# Patient Record
Sex: Female | Born: 1995 | Race: White | Hispanic: No | Marital: Single | Smoking: Current every day smoker
Health system: Southern US, Community
[De-identification: ages and names within clinical notes are randomized; demographics above are authoritative.]

## PROBLEM LIST (undated history)

## (undated) DIAGNOSIS — K5900 Constipation, unspecified: Secondary | ICD-10-CM

## (undated) DIAGNOSIS — R569 Unspecified convulsions: Secondary | ICD-10-CM

## (undated) DIAGNOSIS — N289 Disorder of kidney and ureter, unspecified: Secondary | ICD-10-CM

## (undated) DIAGNOSIS — N200 Calculus of kidney: Secondary | ICD-10-CM

## (undated) HISTORY — PX: OTHER SURGICAL HISTORY: SHX169

---
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
## 2015-01-07 ENCOUNTER — Encounter: Payer: Self-pay | Admitting: Emergency Medicine

## 2015-01-07 DIAGNOSIS — R51 Headache: Secondary | ICD-10-CM | POA: Insufficient documentation

## 2015-01-07 DIAGNOSIS — R42 Dizziness and giddiness: Secondary | ICD-10-CM | POA: Insufficient documentation

## 2015-01-07 DIAGNOSIS — R531 Weakness: Secondary | ICD-10-CM | POA: Insufficient documentation

## 2015-01-07 DIAGNOSIS — Z720 Tobacco use: Secondary | ICD-10-CM | POA: Insufficient documentation

## 2015-01-07 MED ORDER — IBUPROFEN 800 MG PO TABS
ORAL_TABLET | ORAL | Status: AC
  Filled 2015-01-07: qty 1

## 2015-01-07 MED ORDER — IBUPROFEN 800 MG PO TABS: 800.0000 mg | ORAL_TABLET | Freq: Once | ORAL | Status: DC

## 2015-01-07 NOTE — ED Notes (Signed)
Pt to ed with c/o headache intermittently x 1 month, progressively getting worse.  Pt also reports dizziness and weakness associated with headache yesterday.

## 2015-01-07 NOTE — ED Notes (Signed)
After I spoke with Dr Derrill Kay and notified family of order for IBU ,  Pt states she is going to leave and go to [facility].  I advised she should wait to see MD,  Pt states "all my doctors are at [facility]"

## 2015-01-08 ENCOUNTER — Emergency Department
Admission: EM | Admit: 2015-01-08 | Discharge: 2015-01-08 | Discharge status: Against Medical Advice | Payer: Medicaid Other | Attending: Emergency Medicine | Admitting: Emergency Medicine

## 2015-04-24 ENCOUNTER — Ambulatory Visit
Admission: EM | Admit: 2015-04-24 | Discharge: 2015-04-24 | Disposition: A | Discharge status: Home / Self Care | Payer: Medicaid Other | Attending: Family Medicine | Admitting: Family Medicine

## 2015-04-24 DIAGNOSIS — H6501 Acute serous otitis media, right ear: Secondary | ICD-10-CM | POA: Diagnosis not present

## 2015-04-24 MED ORDER — AMOXICILLIN 875 MG PO TABS: 875.0000 mg | ORAL_TABLET | Freq: Two times a day (BID) | ORAL | Status: DC

## 2015-04-24 NOTE — ED Provider Notes (Signed)
CSN: [id number]     Arrival date & time 04/24/15  1800 History   None    Chief Complaint  Patient presents with  . Otalgia   (Consider location/radiation/quality/duration/timing/severity/associated sxs/prior Treatment) Patient is a 19 y.o. female presenting with ear pain.  Otalgia Location:  Right Quality:  Aching, pressure and throbbing Severity:  Moderate Onset quality:  Sudden Timing:  Constant Progression:  Unchanged Chronicity:  New Context: not direct blow, not elevation change, not foreign body in ear, not loud noise and no water in ear   Associated symptoms: congestion, cough and rhinorrhea   Associated symptoms: no abdominal pain, no ear discharge and no rash     No past medical history on file. Past Surgical History  Procedure Laterality Date  . Tubes in ears     Family History  Problem Relation Age of Onset  . Depression Mother   . Heart attack Mother   . GER disease Mother   . GER disease Father    Social History  Substance Use Topics  . Smoking status: Current Every Day Smoker  . Smokeless tobacco: Not on file  . Alcohol Use: No   OB History    Gravida Para Term Preterm AB TAB SAB Ectopic Multiple Living   1    1          Review of Systems  HENT: Positive for congestion, ear pain and rhinorrhea. Negative for ear discharge.   Respiratory: Positive for cough.   Gastrointestinal: Negative for abdominal pain.  Skin: Negative for rash.    Allergies  Review of patient's allergies indicates no known allergies.  Home Medications   Prior to Admission medications   Medication Sig Start Date End Date Taking? Authorizing Provider  acetaminophen (TYLENOL) 500 MG tablet Take 500 mg by mouth every 6 (six) hours as needed.   Yes Historical Provider, MD  medroxyPROGESTERone (DEPO-PROVERA) 150 MG/ML injection Inject 150 mg into the muscle every 3 (three) months.   Yes Historical Provider, MD  amoxicillin (AMOXIL) 875 MG tablet Take 1 tablet (875 mg total) by  mouth 2 (two) times daily. 04/24/15   Barbara Mccallumrlando Janice Bodine, MD   Meds Ordered and Administered this Visit  Medications - No data to display  BP 127/83 mmHg  Pulse 68  Temp(Src) 98.4 F (36.9 C) (Oral)  Resp 16  Ht 5\' 8"  (1.727 m)  Wt 180 lb (81.647 kg)  BMI 27.38 kg/m2  SpO2 100%  LMP 04/15/2015 (Within Days) No data found.   Physical Exam  Constitutional: She appears well-developed and well-nourished. No distress.  HENT:  Right Ear: Tympanic membrane is erythematous and bulging. A middle ear effusion is present.  Nose: Nose normal.  Mouth/Throat: Oropharynx is clear and moist.  Neck: Neck supple. No thyromegaly present.  Cardiovascular: Normal rate, regular rhythm and normal heart sounds.   Pulmonary/Chest: Effort normal and breath sounds normal. No respiratory distress. She has no wheezes. She has no rales.  Lymphadenopathy:    She has no cervical adenopathy.  Skin: She is not diaphoretic.  Nursing note and vitals reviewed.   ED Course  Procedures (including critical care time)  Labs Review Labs Reviewed - No data to display  Imaging Review No results found.   Visual Acuity Review  Right Eye Distance:   Left Eye Distance:   Bilateral Distance:    Right Eye Near:   Left Eye Near:    Bilateral Near:         MDM   1. Right  acute serous otitis media, recurrence not specified    Discharge Medication List as of 04/24/2015  7:46 PM    START taking these medications   Details  amoxicillin (AMOXIL) 875 MG tablet Take 1 tablet (875 mg total) by mouth 2 (two) times daily., Starting 04/24/2015, Until Discontinued, Normal       1. diagnosis reviewed with patient  2. rx as per orders above; reviewed possible side effects, interactions, risks and benefits  3. Recommend supportive treatment with otc analgesics  4. Follow-up prn if symptoms worsen or don't improve    Barbara Mccallum, MD 04/24/15 2135

## 2015-04-24 NOTE — ED Notes (Signed)
Right ear since this morning around 0600. Pt reports she was recently sick with URI sx. Pt reports she does not take allergy medication.

## 2015-07-03 ENCOUNTER — Encounter: Payer: Self-pay | Admitting: *Deleted

## 2015-07-03 ENCOUNTER — Emergency Department
Admission: EM | Admit: 2015-07-03 | Discharge: 2015-07-03 | Disposition: A | Discharge status: Home / Self Care | Payer: BLUE CROSS/BLUE SHIELD | Attending: Emergency Medicine | Admitting: Emergency Medicine

## 2015-07-03 DIAGNOSIS — N750 Cyst of Bartholin's gland: Secondary | ICD-10-CM | POA: Diagnosis not present

## 2015-07-03 DIAGNOSIS — N764 Abscess of vulva: Secondary | ICD-10-CM | POA: Insufficient documentation

## 2015-07-03 DIAGNOSIS — Z792 Long term (current) use of antibiotics: Secondary | ICD-10-CM | POA: Diagnosis not present

## 2015-07-03 DIAGNOSIS — Z79899 Other long term (current) drug therapy: Secondary | ICD-10-CM | POA: Diagnosis not present

## 2015-07-03 DIAGNOSIS — F17200 Nicotine dependence, unspecified, uncomplicated: Secondary | ICD-10-CM | POA: Insufficient documentation

## 2015-07-03 DIAGNOSIS — N898 Other specified noninflammatory disorders of vagina: Secondary | ICD-10-CM | POA: Diagnosis present

## 2015-07-03 MED ORDER — SULFAMETHOXAZOLE-TRIMETHOPRIM 800-160 MG PO TABS: 1.0000 | ORAL_TABLET | Freq: Two times a day (BID) | ORAL | Status: DC

## 2015-07-03 MED ORDER — HYDROCODONE-ACETAMINOPHEN 5-325 MG PO TABS
ORAL_TABLET | ORAL | Status: AC
  Filled 2015-07-03: qty 1

## 2015-07-03 MED ORDER — OXYCODONE-ACETAMINOPHEN 5-325 MG PO TABS
1.0000 | ORAL_TABLET | Freq: Once | ORAL | Status: AC
  Administered 2015-07-03: 1 via ORAL
  Filled 2015-07-03: qty 1

## 2015-07-03 MED ORDER — OXYCODONE-ACETAMINOPHEN 5-325 MG PO TABS: 1.0000 | ORAL_TABLET | ORAL | Status: DC | PRN

## 2015-07-03 MED ORDER — LIDOCAINE-EPINEPHRINE (PF) 1 %-1:200000 IJ SOLN
10.0000 mL | Freq: Once | INTRAMUSCULAR | Status: DC
  Filled 2015-07-03: qty 30

## 2015-07-03 NOTE — ED Provider Notes (Signed)
[facility] Emergency Department Provider Note  ____________________________________________  Time seen: Approximately 2:23 PM  I have reviewed the triage vital signs and the nursing notes.   HISTORY  Chief Complaint Abscess    [name] is a 20 y.o. female is here with complaint of right sided vaginal discomfort for approximately 1 month. Patient states the last 2-3 days and this is gotten much worse. She denies any fever or chills. She denies any previous symptoms such as this and has not seen any physician since her pediatrician. Patient has not taken any over-the-counter medication for pain. Currently she rates her pain as 5 out of 10.   History reviewed. No pertinent past medical history.  There are no active problems to display for this patient.   Past Surgical History  Procedure Laterality Date  . Tubes in ears      Current Outpatient Rx  Name  Route  Sig  Dispense  Refill  . acetaminophen (TYLENOL) 500 MG tablet   Oral   Take 500 mg by mouth every 6 (six) hours as needed.         Marland Kitchen amoxicillin (AMOXIL) 875 MG tablet   Oral   Take 1 tablet (875 mg total) by mouth 2 (two) times daily.   20 tablet   0   . medroxyPROGESTERone (DEPO-PROVERA) 150 MG/ML injection   Intramuscular   Inject 150 mg into the muscle every 3 (three) months.         Marland Kitchen oxyCODONE-acetaminophen (PERCOCET) 5-325 MG tablet   Oral   Take 1 tablet by mouth every 4 (four) hours as needed for severe pain.   20 tablet   0   . sulfamethoxazole-trimethoprim (BACTRIM DS,SEPTRA DS) 800-160 MG tablet   Oral   Take 1 tablet by mouth 2 (two) times daily.   20 tablet   0     Allergies Review of patient's allergies indicates no known allergies.  Family History  Problem Relation Age of Onset  . Depression Mother   . Heart attack Mother   . GER disease Mother   . GER disease Father     Social History Social History  Substance Use Topics  . Smoking  status: Current Every Day Smoker  . Smokeless tobacco: None  . Alcohol Use: No    Review of Systems Constitutional: No fever/chills Eyes: No visual changes. ENT: No sore throat. Cardiovascular: Denies chest pain. Respiratory: Denies shortness of breath. Gastrointestinal: No abdominal pain.  No nausea, no vomiting.  No diarrhea.   Genitourinary: Negative for dysuria. Musculoskeletal: Negative for back pain. Skin: Negative for rash. Questionable abscess present. Neurological: Negative for headaches, focal weakness or numbness.  10-point ROS otherwise negative.  ____________________________________________   PHYSICAL EXAM:  VITAL SIGNS: ED Triage Vitals  Enc Vitals Group     BP 07/03/15 1411 126/74 mmHg     Pulse Rate 07/03/15 1411 94     Resp 07/03/15 1411 18     Temp 07/03/15 1411 97.8 F (36.6 C)     Temp Source 07/03/15 1411 Oral     SpO2 07/03/15 1411 97 %     Weight 07/03/15 1411 185 lb (83.915 kg)     Height 07/03/15 1411  (1.727 m)     Head Cir --      Peak Flow --      Pain Score 07/03/15 1416 5     Pain Loc --      Pain Edu? --  Excl. in GC? --     Constitutional: Alert and oriented. Well appearing and in no acute distress. Eyes: Conjunctivae are normal. PERRL. EOMI. Head: Atraumatic. Nose: No congestion/rhinnorhea. Neck: No stridor.   Cardiovascular: Normal rate, regular rhythm. Grossly normal heart sounds.  Good peripheral circulation. Respiratory: Normal respiratory effort.  No retractions. Lungs CTAB. Gastrointestinal: Soft and nontender. No distention.  Musculoskeletal: There is upper and lower extremities without any difficulty. Neurologic:  Normal speech and language. No gross focal neurologic deficits are appreciated. No gait instability. Skin:  Skin is warm, dry and intact. No rash noted. Positive right labial edema with tenderness. Psychiatric: Mood and affect are normal. Speech and behavior are  normal.  ____________________________________________   LABS (all labs ordered are listed, but only abnormal results are displayed)  Labs Reviewed - No data to display  PROCEDURES  Procedure(s) performed: INCISION AND DRAINAGE Performed by: Tommi Rumps Consent: Verbal consent obtained. Risks and benefits: risks, benefits and alternatives were discussed Type: abscess  Body area: Right labia  Anesthesia: local infiltration  Incision was made with a scalpel.  Local anesthetic: lidocaine 1 % with epinephrine  Anesthetic total: 2 ml  Complexity: complex Blunt dissection to break up loculations  Drainage: purulent  Drainage amount: Moderate   Packing material: 1/4 in iodoform gauze  Patient tolerance: Patient tolerated the procedure well with no immediate complications.    Critical Care performed: No  ____________________________________________   INITIAL IMPRESSION / ASSESSMENT AND PLAN / ED COURSE  Pertinent labs & imaging results that were available during my care of the patient were reviewed by me and considered in my medical decision making (see chart for details).  Patient was instructed to return to the emergency room in 2 days for packing removal. She started on Bactrim DS twice a day for 10 days along with Percocet as needed for severe pain. ____________________________________________   FINAL CLINICAL IMPRESSION(S) / ED DIAGNOSES  Final diagnoses:  Labial abscess  Infected cyst of Bartholin's gland duct      Tommi Rumps, PA-C 07/03/15 1608  Governor Rooks, MD 07/03/15 2220

## 2015-07-03 NOTE — ED Notes (Signed)
Pt c/o abscess to R inner lip of vagina.  Pt sts that this has been an ongoing problem for last month, but pain has incr in last couple of days.  This RN palpated small, red swollen area on pts R inner lip.

## 2015-07-03 NOTE — ED Notes (Signed)
States hard know on right side of vagina

## 2015-07-03 NOTE — Discharge Instructions (Signed)
Incision and Drainage Incision and drainage is a procedure in which a sac-like structure (cystic structure) is opened and drained. The area to be drained usually contains material such as pus, fluid, or blood.  LET YOUR CAREGIVER KNOW ABOUT:   Allergies to medicine.  Medicines taken, including vitamins, herbs, eyedrops, over-the-counter medicines, and creams.  Use of steroids (by mouth or creams).  Previous problems with anesthetics or numbing medicines.  History of bleeding problems or blood clots.  Previous surgery.  Other health problems, including diabetes and kidney problems.  Possibility of pregnancy, if this applies. RISKS AND COMPLICATIONS  Pain.  Bleeding.  Scarring.  Infection. BEFORE THE PROCEDURE  You may need to have an ultrasound or other imaging tests to see how large or deep your cystic structure is. Blood tests may also be used to determine if you have an infection or how severe the infection is. You may need to have a tetanus shot. PROCEDURE  The affected area is cleaned with a cleaning fluid. The cyst area will then be numbed with a medicine (local anesthetic). A small incision will be made in the cystic structure. A syringe or catheter may be used to drain the contents of the cystic structure, or the contents may be squeezed out. The area will then be flushed with a cleansing solution. After cleansing the area, it is often gently packed with a gauze or another wound dressing. Once it is packed, it will be covered with gauze and tape or some other type of wound dressing. AFTER THE PROCEDURE   Often, you will be allowed to go home right after the procedure.  You may be given antibiotic medicine to prevent or heal an infection.  If the area was packed with gauze or some other wound dressing, you will likely need to come back in 1 to 2 days to get it removed.  The area should heal in about 14 days.   This information is not intended to replace advice given  to you by your health care provider. Make sure you discuss any questions you have with your health care provider.   Document Released: 11/25/2000 Document Revised: 12/01/2011 Document Reviewed: 07/27/2011 Elsevier Interactive Patient Education [employer]   Return to the emergency room in 2 days for packing removal Take Percocet as needed for pain only as directed. Bactrim DS twice a day for 10 days.

## 2015-07-03 NOTE — ED Notes (Signed)
abscess drained .Marland Kitchen Tolerated well. Marland Kitchen

## 2015-10-18 ENCOUNTER — Encounter: Payer: Self-pay | Admitting: *Deleted

## 2015-10-18 ENCOUNTER — Ambulatory Visit
Admission: EM | Admit: 2015-10-18 | Discharge: 2015-10-18 | Disposition: A | Discharge status: Home / Self Care | Payer: BLUE CROSS/BLUE SHIELD | Attending: Family Medicine | Admitting: Family Medicine

## 2015-10-18 DIAGNOSIS — J020 Streptococcal pharyngitis: Secondary | ICD-10-CM

## 2015-10-18 HISTORY — DX: Disorder of kidney and ureter, unspecified: N28.9

## 2015-10-18 HISTORY — DX: Unspecified convulsions: R56.9

## 2015-10-18 LAB — RAPID STREP SCREEN (MED CTR MEBANE ONLY): Streptococcus, Group A Screen (Direct): POSITIVE — AB

## 2015-10-18 LAB — RAPID INFLUENZA A&B ANTIGENS
Influenza A (ARMC): NEGATIVE
Influenza B (ARMC): NEGATIVE

## 2015-10-18 MED ORDER — PENICILLIN G BENZATHINE 1200000 UNIT/2ML IM SUSP
1.2000 10*6.[IU] | Freq: Once | INTRAMUSCULAR | Status: AC
  Administered 2015-10-18: 1.2 10*6.[IU] via INTRAMUSCULAR

## 2015-10-18 NOTE — ED Provider Notes (Signed)
CSN: [id number]     Arrival date & time 10/18/15  1934 History   First MD Initiated Contact with Patient 10/18/15 2016     Chief Complaint  Patient presents with  . Cough  . Sore Throat  . Nasal Congestion  . Pleurisy   (Consider location/radiation/quality/duration/timing/severity/associated sxs/prior Treatment) Patient is a 20 y.o. female presenting with cough, pharyngitis, and URI. The history is provided by the patient.  Cough Associated symptoms: rhinorrhea and sore throat   Sore Throat  URI Presenting symptoms: congestion, cough, rhinorrhea and sore throat   Severity:  Moderate Onset quality:  Sudden Duration:  1 day Timing:  Constant Progression:  Worsening Chronicity:  New Relieved by:  Nothing Associated symptoms: swollen glands   Risk factors: sick contacts   Risk factors: not elderly, no chronic cardiac disease, no chronic kidney disease, no chronic respiratory disease, no diabetes mellitus, no immunosuppression and no recent travel     Past Medical History  Diagnosis Date  . Seizures (HCC)   . Renal disorder    Past Surgical History  Procedure Laterality Date  . Tubes in ears     Family History  Problem Relation Age of Onset  . Depression Mother   . Heart attack Mother   . GER disease Mother   . GER disease Father    Social History  Substance Use Topics  . Smoking status: Current Every Day Smoker  . Smokeless tobacco: None  . Alcohol Use: No   OB History    Gravida Para Term Preterm AB TAB SAB Ectopic Multiple Living   1    1          Review of Systems  HENT: Positive for congestion, rhinorrhea and sore throat.   Respiratory: Positive for cough.     Allergies  Review of patient's allergies indicates no known allergies.  Home Medications   Prior to Admission medications   Medication Sig Start Date End Date Taking? Authorizing Provider  acetaminophen (TYLENOL) 500 MG tablet Take 500 mg by mouth every 6 (six) hours as needed.    Historical  Provider, MD  amoxicillin (AMOXIL) 875 MG tablet Take 1 tablet (875 mg total) by mouth 2 (two) times daily. 04/24/15   Barbara Mccallumrlando Flynn Lininger, MD  medroxyPROGESTERone (DEPO-PROVERA) 150 MG/ML injection Inject 150 mg into the muscle every 3 (three) months.    Historical Provider, MD  oxyCODONE-acetaminophen (PERCOCET) 5-325 MG tablet Take 1 tablet by mouth every 4 (four) hours as needed for severe pain. 07/03/15   Barbara Rumpshonda L Summers, PA-C  sulfamethoxazole-trimethoprim (BACTRIM DS,SEPTRA DS) 800-160 MG tablet Take 1 tablet by mouth 2 (two) times daily. 07/03/15   Barbara Rumpshonda L Summers, PA-C   Meds Ordered and Administered this Visit   Medications  penicillin g benzathine (BICILLIN LA) 1200000 UNIT/2ML injection 1.2 Million Units (not administered)    BP 124/77 mmHg  Pulse 102  Temp(Src) 97.9 F (36.6 C)  Resp 20  Ht 5\' 8"  (1.727 m)  Wt 165 lb (74.844 kg)  BMI 25.09 kg/m2  SpO2 99% No data found.   Physical Exam  Constitutional: She appears well-developed and well-nourished. No distress.  HENT:  Head: Normocephalic and atraumatic.  Right Ear: Tympanic membrane, external ear and ear canal normal.  Left Ear: Tympanic membrane, external ear and ear canal normal.  Nose: Mucosal edema and rhinorrhea present. No nose lacerations, sinus tenderness, nasal deformity, septal deviation or nasal septal hematoma. No epistaxis.  No foreign bodies.  Mouth/Throat: Uvula is midline and mucous membranes  are normal. Posterior oropharyngeal erythema present. No oropharyngeal exudate.  Eyes: Conjunctivae and EOM are normal. Pupils are equal, round, and reactive to light. Right eye exhibits no discharge. Left eye exhibits no discharge. No scleral icterus.  Neck: Normal range of motion. Neck supple. No thyromegaly present.  Cardiovascular: Normal rate, regular rhythm and normal heart sounds.   Pulmonary/Chest: Effort normal and breath sounds normal. No respiratory distress. She has no wheezes. She has no rales.   Lymphadenopathy:    She has no cervical adenopathy.  Skin: She is not diaphoretic.  Nursing note and vitals reviewed.   ED Course  Procedures (including critical care time)  Labs Review Labs Reviewed  RAPID STREP SCREEN (NOT AT [facility]) - Abnormal; Notable for the following:    Streptococcus, Group A Screen (Direct) POSITIVE (*)    All other components within normal limits  RAPID INFLUENZA A&B ANTIGENS ([facility] ONLY)    Imaging Review No results found.   Visual Acuity Review  Right Eye Distance:   Left Eye Distance:   Bilateral Distance:    Right Eye Near:   Left Eye Near:    Bilateral Near:         MDM   1. Strep pharyngitis    1. Lab results and diagnosis reviewed with patient and parent 2. Bicillin 1.2 mU IM x 1 given in clinic 3. Recommend supportive treatment with salt water gargles and otc analgesics 4. Follow-up prn if symptoms worsen or don't improve    Barbara Mccallum, MD 10/18/15 2111

## 2015-10-18 NOTE — ED Notes (Signed)
Productive cough- yellow, sore throat, runny nose, and chest soreness with coughing and movement. Onset yesterday.

## 2016-02-14 ENCOUNTER — Encounter: Payer: Self-pay | Admitting: Emergency Medicine

## 2016-02-14 ENCOUNTER — Emergency Department
Admission: EM | Admit: 2016-02-14 | Discharge: 2016-02-14 | Disposition: A | Discharge status: Home / Self Care | Payer: BLUE CROSS/BLUE SHIELD | Attending: Emergency Medicine | Admitting: Emergency Medicine

## 2016-02-14 DIAGNOSIS — J309 Allergic rhinitis, unspecified: Secondary | ICD-10-CM | POA: Insufficient documentation

## 2016-02-14 DIAGNOSIS — Z79899 Other long term (current) drug therapy: Secondary | ICD-10-CM | POA: Insufficient documentation

## 2016-02-14 DIAGNOSIS — J029 Acute pharyngitis, unspecified: Secondary | ICD-10-CM | POA: Diagnosis present

## 2016-02-14 DIAGNOSIS — F17200 Nicotine dependence, unspecified, uncomplicated: Secondary | ICD-10-CM | POA: Insufficient documentation

## 2016-02-14 DIAGNOSIS — J069 Acute upper respiratory infection, unspecified: Secondary | ICD-10-CM | POA: Diagnosis not present

## 2016-02-14 HISTORY — DX: Unspecified convulsions: R56.9

## 2016-02-14 HISTORY — DX: Calculus of kidney: N20.0

## 2016-02-14 MED ORDER — BENZONATATE 100 MG PO CAPS: 200.0000 mg | ORAL_CAPSULE | Freq: Three times a day (TID) | ORAL | 0 refills | Status: AC | PRN

## 2016-02-14 MED ORDER — LORATADINE 10 MG PO TABS: 10.0000 mg | ORAL_TABLET | Freq: Every day | ORAL | 0 refills | Status: DC

## 2016-02-14 MED ORDER — FLUTICASONE PROPIONATE 50 MCG/ACT NA SUSP: 2.0000 | Freq: Every day | NASAL | 0 refills | Status: DC

## 2016-02-14 NOTE — Discharge Instructions (Signed)
[facility] any [facility]. Begin using Tessalon Perles as needed for cough. Claritin once a day and this medication is also over-the-counter so you may continue taking this at any time. Also began using Flonase nasal spray 2 sprays in each side of her nose once a day for control of her allergies.

## 2016-02-14 NOTE — ED Provider Notes (Signed)
[facility] Emergency Department Provider Note  ____________________________________________   First MD Initiated Contact with Patient 02/14/16 1636     (approximate)  I have reviewed the triage vital signs and the nursing notes.   HISTORY  Chief Complaint Sore Throat; Cough; and Nasal Congestion   [name] is a 20 y.o. female is here complaining of sore throat, cough, nasal congestion and sneezing. Patient states that this began approximately 2 days ago and she is not taking any over-the-counter medication for this. She denies any nausea or vomiting. There is been no fever or chills. Patient has a history of allergies and for 4 years took medication for this. She is not taking any allergy medication since that time. Patient states she was at work when she felt "lightheaded" while coughing and came straight to the emergency room. Currently she rates her pain as a 2 out of 10.   Past Medical History:  Diagnosis Date  . Kidney stone   . Renal disorder   . Seizure (HCC)   . Seizures (HCC)     There are no active problems to display for this patient.   Past Surgical History:  Procedure Laterality Date  . tubes in ears      Prior to Admission medications   Medication Sig Start Date End Date Taking? Authorizing Provider  acetaminophen (TYLENOL) 500 MG tablet Take 500 mg by mouth every 6 (six) hours as needed.    Historical Provider, MD  amoxicillin (AMOXIL) 875 MG tablet Take 1 tablet (875 mg total) by mouth 2 (two) times daily. 04/24/15   Payton Mccallumrlando Conty, MD  benzonatate (TESSALON PERLES) 100 MG capsule Take 2 capsules (200 mg total) by mouth 3 (three) times daily as needed for cough. 02/14/16 02/13/17  Tommi Rumpshonda L Summers, PA-C  fluticasone (FLONASE) 50 MCG/ACT nasal spray Place 2 sprays into both nostrils daily. 02/14/16 02/13/17  Tommi Rumpshonda L Summers, PA-C  loratadine (CLARITIN) 10 MG tablet Take 1 tablet (10 mg total) by mouth daily. 02/14/16 02/13/17   Tommi Rumpshonda L Summers, PA-C  medroxyPROGESTERone (DEPO-PROVERA) 150 MG/ML injection Inject 150 mg into the muscle every 3 (three) months.    Historical Provider, MD  oxyCODONE-acetaminophen (PERCOCET) 5-325 MG tablet Take 1 tablet by mouth every 4 (four) hours as needed for severe pain. 07/03/15   Tommi Rumpshonda L Summers, PA-C  sulfamethoxazole-trimethoprim (BACTRIM DS,SEPTRA DS) 800-160 MG tablet Take 1 tablet by mouth 2 (two) times daily. 07/03/15   Tommi Rumpshonda L Summers, PA-C    Allergies Review of patient's allergies indicates no known allergies.  Family History  Problem Relation Age of Onset  . Depression Mother   . Heart attack Mother   . GER disease Mother   . GER disease Father     Social History Social History  Substance Use Topics  . Smoking status: Current Every Day Smoker  . Smokeless tobacco: Never Used  . Alcohol use No    Review of Systems Constitutional: No fever/chills Eyes: No visual changes. ENT: Positive sore throat. Positive rhinitis Cardiovascular: Denies chest pain. Respiratory: Denies shortness of breath. Positive for cough. Gastrointestinal:   No nausea, no vomiting.   Skin: Negative for rash. Neurological: Negative for headaches, focal weakness or numbness.  10-point ROS otherwise negative.  ____________________________________________   PHYSICAL EXAM:  VITAL SIGNS: ED Triage Vitals  Enc Vitals Group     BP 02/14/16 1521 114/73     Pulse Rate 02/14/16 1521 98     Resp 02/14/16 1521 18  Temp 02/14/16 1521 98.2 F (36.8 C)     Temp Source 02/14/16 1521 Oral     SpO2 02/14/16 1521 99 %     Weight 02/14/16 1522 165 lb (74.8 kg)     Height 02/14/16 1522 5\' 8"  (1.727 m)     Head Circumference --      Peak Flow --      Pain Score 02/14/16 1522 2     Pain Loc --      Pain Edu? --      Excl. in GC? --     Constitutional: Alert and oriented. Well appearing and in no acute distress. Eyes: Conjunctivae are normal. PERRL. EOMI. Head: Atraumatic. Nose:  Moderate congestion/rhinnorhea.  EACs are clear bilaterally. TMs are dull bilaterally without any injection or erythema.  Mouth/Throat: Mucous membranes are moist.  Oropharynx non-erythematous. Posterior drainage is noted. Neck: No stridor.   Hematological/Lymphatic/Immunilogical: No cervical lymphadenopathy. Cardiovascular: Normal rate, regular rhythm. Grossly normal heart sounds.  Good peripheral circulation. Respiratory: Normal respiratory effort.  No retractions. Lungs CTAB. Musculoskeletal: Moves upper and lower extremities without any difficulty. Normal gait was noted. Neurologic:  Normal speech and language. No gross focal neurologic deficits are appreciated. No gait instability. Skin:  Skin is warm, dry and intact. No rash noted. Psychiatric: Mood and affect are normal. Speech and behavior are normal.  ____________________________________________   LABS (all labs ordered are listed, but only abnormal results are displayed)  Labs Reviewed - No data to display   PROCEDURES  Procedure(s) performed: None  Procedures  Critical Care performed: No  ____________________________________________   INITIAL IMPRESSION / ASSESSMENT AND PLAN / ED COURSE  Pertinent labs & imaging results that were available during my care of the patient were reviewed by me and considered in my medical decision making (see chart for details).    Clinical Course   This is given a prescription for Claritin 10 mg one daily, Flonase nasal spray, and Tessalon Perles as needed for cough. Patient is to follow-up with [facility] if any continued problems. She is also instructed to drink more fluids and if needed Tylenol or ibuprofen.  ____________________________________________   FINAL CLINICAL IMPRESSION(S) / ED DIAGNOSES  Final diagnoses:  Acute upper respiratory infection  Allergic rhinitis, unspecified allergic rhinitis type      NEW MEDICATIONS STARTED DURING THIS VISIT:  Discharge  Medication List as of 02/14/2016  4:49 PM    START taking these medications   Details  benzonatate (TESSALON PERLES) 100 MG capsule Take 2 capsules (200 mg total) by mouth 3 (three) times daily as needed for cough., Starting Fri 02/14/2016, Until Sat 02/13/2017, Print    fluticasone (FLONASE) 50 MCG/ACT nasal spray Place 2 sprays into both nostrils daily., Starting Fri 02/14/2016, Until Sat 02/13/2017, Print    loratadine (CLARITIN) 10 MG tablet Take 1 tablet (10 mg total) by mouth daily., Starting Fri 02/14/2016, Until Sat 02/13/2017, Print         Note:  This document was prepared using Dragon voice recognition software and may include unintentional dictation errors.    Tommi Rumps, PA-C 02/14/16 1657    Emily Filbert, MD 02/15/16 [phone]

## 2016-02-14 NOTE — ED Triage Notes (Signed)
Reports cough, runny nose and sore throat for several days.  Today at work felt lightheaded and her work sent her for eval. NAD, skin w/d.

## 2016-04-17 ENCOUNTER — Encounter: Payer: Self-pay | Admitting: Emergency Medicine

## 2016-04-17 ENCOUNTER — Emergency Department
Admission: EM | Admit: 2016-04-17 | Discharge: 2016-04-17 | Disposition: A | Discharge status: Home / Self Care | Payer: BLUE CROSS/BLUE SHIELD | Attending: Emergency Medicine | Admitting: Emergency Medicine

## 2016-04-17 DIAGNOSIS — Z79899 Other long term (current) drug therapy: Secondary | ICD-10-CM | POA: Insufficient documentation

## 2016-04-17 DIAGNOSIS — Y999 Unspecified external cause status: Secondary | ICD-10-CM | POA: Diagnosis not present

## 2016-04-17 DIAGNOSIS — M7918 Myalgia, other site: Secondary | ICD-10-CM

## 2016-04-17 DIAGNOSIS — S39012A Strain of muscle, fascia and tendon of lower back, initial encounter: Secondary | ICD-10-CM | POA: Insufficient documentation

## 2016-04-17 DIAGNOSIS — Y9389 Activity, other specified: Secondary | ICD-10-CM | POA: Diagnosis not present

## 2016-04-17 DIAGNOSIS — S161XXA Strain of muscle, fascia and tendon at neck level, initial encounter: Secondary | ICD-10-CM | POA: Diagnosis not present

## 2016-04-17 DIAGNOSIS — Y92410 Unspecified street and highway as the place of occurrence of the external cause: Secondary | ICD-10-CM | POA: Insufficient documentation

## 2016-04-17 DIAGNOSIS — F17200 Nicotine dependence, unspecified, uncomplicated: Secondary | ICD-10-CM | POA: Insufficient documentation

## 2016-04-17 DIAGNOSIS — S199XXA Unspecified injury of neck, initial encounter: Secondary | ICD-10-CM | POA: Diagnosis present

## 2016-04-17 MED ORDER — KETOROLAC TROMETHAMINE 60 MG/2ML IM SOLN
30.0000 mg | Freq: Once | INTRAMUSCULAR | Status: AC
  Administered 2016-04-17: 30 mg via INTRAMUSCULAR
  Filled 2016-04-17: qty 2

## 2016-04-17 MED ORDER — OXYCODONE-ACETAMINOPHEN 7.5-325 MG PO TABS: 1.0000 | ORAL_TABLET | Freq: Four times a day (QID) | ORAL | 0 refills | Status: DC | PRN

## 2016-04-17 MED ORDER — IBUPROFEN 600 MG PO TABS: 600.0000 mg | ORAL_TABLET | Freq: Three times a day (TID) | ORAL | 0 refills | Status: DC | PRN

## 2016-04-17 MED ORDER — HYDROMORPHONE HCL 1 MG/ML IJ SOLN
1.0000 mg | Freq: Once | INTRAMUSCULAR | Status: AC
  Administered 2016-04-17: 1 mg via INTRAMUSCULAR
  Filled 2016-04-17: qty 1

## 2016-04-17 MED ORDER — CYCLOBENZAPRINE HCL 10 MG PO TABS: 10.0000 mg | ORAL_TABLET | Freq: Three times a day (TID) | ORAL | 0 refills | Status: DC | PRN

## 2016-04-17 MED ORDER — ORPHENADRINE CITRATE 30 MG/ML IJ SOLN
60.0000 mg | Freq: Two times a day (BID) | INTRAMUSCULAR | Status: DC
  Administered 2016-04-17: 60 mg via INTRAMUSCULAR
  Filled 2016-04-17: qty 2
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
## 2016-04-17 NOTE — ED Notes (Signed)
Patient stated that her feet were cold and asked if she could have a pair of socks. She was given a pair of the brown grip bottom socks.

## 2016-10-05 ENCOUNTER — Ambulatory Visit
Admission: EM | Admit: 2016-10-05 | Discharge: 2016-10-05 | Disposition: A | Discharge status: Home / Self Care | Payer: BLUE CROSS/BLUE SHIELD | Attending: Family Medicine | Admitting: Family Medicine

## 2016-10-05 DIAGNOSIS — M545 Low back pain: Secondary | ICD-10-CM

## 2016-10-05 DIAGNOSIS — S39012A Strain of muscle, fascia and tendon of lower back, initial encounter: Secondary | ICD-10-CM

## 2016-10-05 MED ORDER — NAPROXEN 500 MG PO TABS: 500.0000 mg | ORAL_TABLET | Freq: Two times a day (BID) | ORAL | 0 refills | Status: DC

## 2016-10-05 MED ORDER — TIZANIDINE HCL 4 MG PO CAPS: 4.0000 mg | ORAL_CAPSULE | Freq: Three times a day (TID) | ORAL | 0 refills | Status: DC
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
## 2017-09-03 ENCOUNTER — Ambulatory Visit
Admission: EM | Admit: 2017-09-03 | Discharge: 2017-09-03 | Discharge status: Absent without leave | Payer: BLUE CROSS/BLUE SHIELD

## 2017-12-15 ENCOUNTER — Other Ambulatory Visit: Payer: Self-pay

## 2017-12-15 ENCOUNTER — Encounter: Payer: Self-pay | Admitting: Emergency Medicine

## 2017-12-15 ENCOUNTER — Emergency Department
Admission: EM | Admit: 2017-12-15 | Discharge: 2017-12-15 | Disposition: A | Discharge status: Home / Self Care | Payer: BLUE CROSS/BLUE SHIELD | Attending: Student in an Organized Health Care Education/Training Program | Admitting: Student in an Organized Health Care Education/Training Program

## 2017-12-15 DIAGNOSIS — O039 Complete or unspecified spontaneous abortion without complication: Secondary | ICD-10-CM

## 2017-12-15 DIAGNOSIS — F17200 Nicotine dependence, unspecified, uncomplicated: Secondary | ICD-10-CM | POA: Diagnosis not present

## 2017-12-15 DIAGNOSIS — O209 Hemorrhage in early pregnancy, unspecified: Secondary | ICD-10-CM | POA: Diagnosis present

## 2017-12-15 DIAGNOSIS — Z79899 Other long term (current) drug therapy: Secondary | ICD-10-CM | POA: Insufficient documentation

## 2017-12-15 DIAGNOSIS — Z3A01 Less than 8 weeks gestation of pregnancy: Secondary | ICD-10-CM | POA: Insufficient documentation

## 2017-12-15 DIAGNOSIS — N939 Abnormal uterine and vaginal bleeding, unspecified: Secondary | ICD-10-CM

## 2017-12-15 LAB — URINALYSIS, COMPLETE (UACMP) WITH MICROSCOPIC
Bacteria, UA: NONE SEEN
Bilirubin Urine: NEGATIVE
Glucose, UA: NEGATIVE mg/dL
Ketones, ur: 5 mg/dL — AB
Nitrite: NEGATIVE
PROTEIN: 30 mg/dL — AB
RBC / HPF: >50 RBC/hpf — ABNORMAL HIGH (ref 0–5)
SPECIFIC GRAVITY, URINE: 1.027 (ref 1.005–1.030)
pH: 5 (ref 5.0–8.0)

## 2017-12-15 LAB — CBC WITH DIFFERENTIAL/PLATELET
BASOS PCT: 0 %
Basophils Absolute: 0 10*3/uL (ref 0–0.1)
EOS PCT: 0 %
Eosinophils Absolute: 0 10*3/uL (ref 0–0.7)
HEMATOCRIT: 40.5 % (ref 35.0–47.0)
HEMOGLOBIN: 13.8 g/dL (ref 12.0–16.0)
LYMPHS ABS: 1.9 10*3/uL (ref 1.0–3.6)
LYMPHS PCT: 17 %
MCH: 27.7 pg (ref 26.0–34.0)
MCHC: 34.1 g/dL (ref 32.0–36.0)
MCV: 81.2 fL (ref 80.0–100.0)
MONO ABS: 1 10*3/uL — AB (ref 0.2–0.9)
MONOS PCT: 9 %
NEUTROS ABS: 8.2 10*3/uL — AB (ref 1.4–6.5)
Neutrophils Relative %: 74 %
Platelets: 308 10*3/uL (ref 150–440)
RBC: 4.98 MIL/uL (ref 3.80–5.20)
RDW: 13.8 % (ref 11.5–14.5)
WBC: 11.2 10*3/uL — ABNORMAL HIGH (ref 3.6–11.0)

## 2017-12-15 LAB — COMPREHENSIVE METABOLIC PANEL
ALT: 12 U/L (ref 0–44)
AST: 18 U/L (ref 15–41)
Albumin: 4.2 g/dL (ref 3.5–5.0)
Alkaline Phosphatase: 89 U/L (ref 38–126)
Anion gap: 8 (ref 5–15)
BUN: 9 mg/dL (ref 6–20)
CO2: 23 mmol/L (ref 22–32)
Calcium: 9 mg/dL (ref 8.9–10.3)
Chloride: 109 mmol/L (ref 98–111)
Creatinine, Ser: 0.73 mg/dL (ref 0.44–1.00)
GFR calc Af Amer: >60 mL/min (ref >60)
GFR calc non Af Amer: >60 mL/min (ref >60)
Glucose, Bld: 94 mg/dL (ref 70–99)
Potassium: 3.5 mmol/L (ref 3.5–5.1)
Sodium: 140 mmol/L (ref 135–145)
Total Bilirubin: 0.8 mg/dL (ref 0.3–1.2)
Total Protein: 7.1 g/dL (ref 6.5–8.1)

## 2017-12-15 LAB — POCT PREGNANCY, URINE: PREG TEST UR: NEGATIVE

## 2017-12-15 LAB — ABO/RH: ABO/RH(D): A POS

## 2017-12-15 LAB — HCG, QUANTITATIVE, PREGNANCY

## 2017-12-15 MED ORDER — ACETAMINOPHEN 500 MG PO TABS
1000.0000 mg | ORAL_TABLET | Freq: Once | ORAL | Status: AC
  Administered 2017-12-15: 1000 mg via ORAL
  Filled 2017-12-15: qty 2

## 2017-12-15 NOTE — ED Provider Notes (Signed)
[facility] Emergency Department Provider Note    First MD Initiated Contact with Patient 12/15/17 2130     (approximate)  I have reviewed the triage vital signs and the nursing notes.   HISTORY  Chief Complaint Vaginal Bleeding    [name] is a 22 y.o. female G3, P0 presents to the ER roughly 1 month pregnant with crampy lower abdominal discomfort as well as vaginal bleeding where she passed several clots.  States it was more severe earlier today and she has had 2 previous miscarriages and was worried that she was having the same.  No syncopal episodes.  Denies any history of bleeding disorders.  States that on arrival that she is no longer bleeding and that the cramping has subsided.  Denies any fevers.  States the pain was mild to moderate but is now absent.    Past Medical History:  Diagnosis Date  . Kidney stone   . Renal disorder   . Seizure (HCC)   . Seizures (HCC)    Family History  Problem Relation Age of Onset  . Depression Mother   . Heart attack Mother   . GER disease Mother   . GER disease Father    Past Surgical History:  Procedure Laterality Date  . tubes in ears     There are no active problems to display for this patient.     Prior to Admission medications   Medication Sig Start Date End Date Taking? Authorizing Provider  acetaminophen (TYLENOL) 500 MG tablet Take 500 mg by mouth every 6 (six) hours as needed.    [provider]  amoxicillin (AMOXIL) 875 MG tablet Take 1 tablet (875 mg total) by mouth 2 (two) times daily. 04/24/15   Payton Mccallum, MD  cyclobenzaprine (FLEXERIL) 10 MG tablet Take 1 tablet (10 mg total) by mouth 3 (three) times daily as needed. 04/17/16   Joni Reining, PA-C  fluticasone (FLONASE) 50 MCG/ACT nasal spray Place 2 sprays into both nostrils daily. 02/14/16 02/13/17  Tommi Rumps, PA-C  ibuprofen (ADVIL,MOTRIN) 600 MG tablet Take 1 tablet (600 mg total) by mouth every 8  (eight) hours as needed. 04/17/16   Joni Reining, PA-C  loratadine (CLARITIN) 10 MG tablet Take 1 tablet (10 mg total) by mouth daily. 02/14/16 02/13/17  Tommi Rumps, PA-C  medroxyPROGESTERone (DEPO-PROVERA) 150 MG/ML injection Inject 150 mg into the muscle every 3 (three) months.    [provider]  naproxen (NAPROSYN) 500 MG tablet Take 1 tablet (500 mg total) by mouth 2 (two) times daily with a meal. 10/05/16   Lutricia Feil, PA-C  oxyCODONE-acetaminophen (PERCOCET) 5-325 MG tablet Take 1 tablet by mouth every 4 (four) hours as needed for severe pain. 07/03/15   Tommi Rumps, PA-C  oxyCODONE-acetaminophen (PERCOCET) 7.5-325 MG tablet Take 1 tablet by mouth every 6 (six) hours as needed for severe pain. 04/17/16   Joni Reining, PA-C  sulfamethoxazole-trimethoprim (BACTRIM DS,SEPTRA DS) 800-160 MG tablet Take 1 tablet by mouth 2 (two) times daily. 07/03/15   Tommi Rumps, PA-C  tiZANidine (ZANAFLEX) 4 MG capsule Take 1 capsule (4 mg total) by mouth 3 (three) times daily. 10/05/16   Lutricia Feil, PA-C    Allergies Patient has no known allergies.    Social History Social History   Tobacco Use  . Smoking status: Current Every Day Smoker    Packs/day: 1.00  . Smokeless tobacco: Never Used  Substance Use Topics  .  Alcohol use: Yes  . Drug use: No    Review of Systems Patient denies headaches, rhinorrhea, blurry vision, numbness, shortness of breath, chest pain, edema, cough, abdominal pain, nausea, vomiting, diarrhea, dysuria, fevers, rashes or hallucinations unless otherwise stated above in HPI. ____________________________________________   PHYSICAL EXAM:  VITAL SIGNS: Vitals:   12/15/17 2056 12/15/17 2209  BP: (!) 150/93 132/78  Pulse: 92 88  Resp: 20 16  Temp: 98 F (36.7 C)   SpO2: 100% 100%    Constitutional: Alert and oriented.  Eyes: Conjunctivae are normal.  Head: Atraumatic. Nose: No congestion/rhinnorhea. Mouth/Throat: Mucous  membranes are moist.   Neck: No stridor. Painless ROM.  Cardiovascular: Normal rate, regular rhythm. Grossly normal heart sounds.  Good peripheral circulation. Respiratory: Normal respiratory effort.  No retractions. Lungs CTAB. Gastrointestinal: Soft and nontender. No distention. No abdominal bruits. No CVA tenderness. Genitourinary: patient declined Musculoskeletal: No lower extremity tenderness nor edema.  No joint effusions. Neurologic:  Normal speech and language. No gross focal neurologic deficits are appreciated. No facial droop Skin:  Skin is warm, dry and intact. No rash noted. Psychiatric: Mood and affect are normal. Speech and behavior are normal.  ____________________________________________   LABS (all labs ordered are listed, but only abnormal results are displayed)  Results for orders placed or performed during the hospital encounter of 12/15/17 (from the past 24 hour(s))  CBC with Differential     Status: Abnormal   Collection Time: 12/15/17  8:58 PM  Result Value Ref Range   WBC 11.2 (H) 3.6 - 11.0 K/uL   RBC 4.98 3.80 - 5.20 MIL/uL   Hemoglobin 13.8 12.0 - 16.0 g/dL   HCT 16.1 09.6 - 04.5 %   MCV 81.2 80.0 - 100.0 fL   MCH 27.7 26.0 - 34.0 pg   MCHC 34.1 32.0 - 36.0 g/dL   RDW 40.9 81.1 - 91.4 %   Platelets 308 150 - 440 K/uL   Neutrophils Relative % 74 %   Neutro Abs 8.2 (H) 1.4 - 6.5 K/uL   Lymphocytes Relative 17 %   Lymphs Abs 1.9 1.0 - 3.6 K/uL   Monocytes Relative 9 %   Monocytes Absolute 1.0 (H) 0.2 - 0.9 K/uL   Eosinophils Relative 0 %   Eosinophils Absolute 0.0 0 - 0.7 K/uL   Basophils Relative 0 %   Basophils Absolute 0.0 0 - 0.1 K/uL  Comprehensive metabolic panel     Status: None   Collection Time: 12/15/17  8:58 PM  Result Value Ref Range   Sodium 140 135 - 145 mmol/L   Potassium 3.5 3.5 - 5.1 mmol/L   Chloride 109 98 - 111 mmol/L   CO2 23 22 - 32 mmol/L   Glucose, Bld 94 70 - 99 mg/dL   BUN 9 6 - 20 mg/dL   Creatinine, Ser 7.82 0.44 -  1.00 mg/dL   Calcium 9.0 8.9 - 95.6 mg/dL   Total Protein 7.1 6.5 - 8.1 g/dL   Albumin 4.2 3.5 - 5.0 g/dL   AST 18 15 - 41 U/L   ALT 12 0 - 44 U/L   Alkaline Phosphatase 89 38 - 126 U/L   Total Bilirubin 0.8 0.3 - 1.2 mg/dL   GFR calc non Af Amer >60 >60 mL/min   GFR calc Af Amer >60 >60 mL/min   Anion gap 8 5 - 15  hCG, quantitative, pregnancy     Status: None   Collection Time: 12/15/17  8:58 PM  Result Value Ref Range  hCG, Beta Chain, Quant, S <1 <5 mIU/mL  ABO/Rh     Status: None   Collection Time: 12/15/17  8:58 PM  Result Value Ref Range   ABO/RH(D)      A POS Performed at [facility], [address] [place], [place] [address]   Urinalysis, Complete w Microscopic     Status: Abnormal   Collection Time: 12/15/17  8:58 PM  Result Value Ref Range   Color, Urine YELLOW (A) YELLOW   APPearance HAZY (A) CLEAR   Specific Gravity, Urine 1.027 1.005 - 1.030   pH 5.0 5.0 - 8.0   Glucose, UA NEGATIVE NEGATIVE mg/dL   Hgb urine dipstick LARGE (A) NEGATIVE   Bilirubin Urine NEGATIVE NEGATIVE   Ketones, ur 5 (A) NEGATIVE mg/dL   Protein, ur 30 (A) NEGATIVE mg/dL   Nitrite NEGATIVE NEGATIVE   Leukocytes, UA TRACE (A) NEGATIVE   RBC / HPF >50 (H) 0 - 5 RBC/hpf   WBC, UA 6-10 0 - 5 WBC/hpf   Bacteria, UA NONE SEEN NONE SEEN   Squamous Epithelial / LPF 6-10 0 - 5   Mucus PRESENT   Pregnancy, urine POC     Status: None   Collection Time: 12/15/17  9:03 PM  Result Value Ref Range   Preg Test, Ur NEGATIVE NEGATIVE   ____________________________________________ ____________________________________________   PROCEDURES  Procedure(s) performed:  Procedures    Critical Care performed: no ____________________________________________   INITIAL IMPRESSION / ASSESSMENT AND PLAN / ED COURSE  Pertinent labs & imaging results that were available during my care of the patient were reviewed by me and considered in my medical decision making (see chart for details).     DDX: Miscarriage, threatened AB, ectopic, cervicitis  Harl Favorllen M Richman is a 22 y.o. who presents to the ED with symptoms as described above.  Repeat pregnancy test here is negative and confirmed with blood testing concerning for miscarriage as she did states that she did have positive pregnancy test at home.  She is not actively bleeding denies any pain at this time.  She is Rh+.  I did recommend pelvic exam to further evaluate for incomplete miscarriage to evaluate for retained products of conception or evaluate for infectious process but the patient has declined this.  She demonstrates understanding that failure to complete this pelvic exam limits my ability to further rule out other pathology.  States that she prefers to follow-up with her PCP.  She is hemodynamically stable and it is most likely miscarriage and she has had similar in the past, this is reasonable. Have discussed with the patient and available family all diagnostics and treatments performed thus far and all questions were answered to the best of my ability. The patient demonstrates understanding and agreement with plan.      As part of my medical decision making, I reviewed the following data within the electronic MEDICAL RECORD NUMBER Nursing notes reviewed and incorporated, Labs reviewed, notes from prior ED visits.  ____________________________________________   FINAL CLINICAL IMPRESSION(S) / ED DIAGNOSES  Final diagnoses:  Vaginal bleeding  Miscarriage      NEW MEDICATIONS STARTED DURING THIS VISIT:  Discharge Medication List as of 12/15/2017 10:00 PM       Note:  This document was prepared using Dragon voice recognition software and may include unintentional dictation errors.    Willy Eddyobinson, Ousmane Seeman, MD 12/15/17 2227

## 2017-12-15 NOTE — ED Notes (Signed)
E-signature pad unavailable due to downtime.  Patient verbalized understanding of discharge instruction and follow up care.

## 2017-12-15 NOTE — ED Triage Notes (Signed)
Patient ambulatory to triage with steady gait, without difficulty or distress noted; pt reports vag bleeding today with lower abd cramping; +home pregnancy test; G3P0

## 2017-12-22 ENCOUNTER — Ambulatory Visit: Payer: Self-pay | Admitting: Obstetrics and Gynecology

## 2017-12-27 ENCOUNTER — Ambulatory Visit: Payer: Self-pay | Admitting: Obstetrics and Gynecology

## 2017-12-28 ENCOUNTER — Encounter: Payer: Self-pay | Admitting: Obstetrics and Gynecology

## 2017-12-28 ENCOUNTER — Ambulatory Visit (INDEPENDENT_AMBULATORY_CARE_PROVIDER_SITE_OTHER): Payer: Medicaid Other | Admitting: Obstetrics and Gynecology

## 2017-12-28 VITALS — BP 132/82 | Ht 68.0 in | Wt 194.0 lb

## 2017-12-28 DIAGNOSIS — O0281 Inappropriate change in quantitative human chorionic gonadotropin (hCG) in early pregnancy: Secondary | ICD-10-CM

## 2017-12-28 DIAGNOSIS — N96 Recurrent pregnancy loss: Secondary | ICD-10-CM

## 2017-12-28 NOTE — Progress Notes (Signed)
Obstetric Problem Visit    Chief Complaint:  Chief Complaint  Patient presents with  . ER follow up    First trimester bleeding/cramping miscarriage    History of Present Illness: Patient is a 22 y.o. G3P0030 , LMP of sometime around early June who presented for ER on 12/15/17 after 5 positive home pregnancy tests and vaginal bleeding.  UPT and HCG negative in the emergency department and per patients LMP would be consistent with the timing of her next menstrual cycle.  She has had two prior first trimester losses.  Is actively trying to conceive.  Bleeding subsided since ER visit  Is bleeding equal to or greater than normal menstrual flow:    Any recent trauma:  No History of prior miscarriage:  Yes Prior ultrasound demonstrating IUP: none.  Prior ultrasound demonstrating viable IUP:  No Prior Serum HCG:  Yes negative  Review of Systems: Review of Systems  Constitutional: Negative.   Gastrointestinal: Negative.   Endo/Heme/Allergies: Negative.   Psychiatric/Behavioral: Negative.     Past Medical History:  Past Medical History:  Diagnosis Date  . Kidney stone   . Renal disorder   . Seizure (HCC)   . Seizures (HCC)     Past Surgical History:  Past Surgical History:  Procedure Laterality Date  . tubes in ears      Obstetric History: G3P0030  Family History:  Family History  Problem Relation Age of Onset  . Depression Mother   . Heart attack Mother   . GER disease Mother   . GER disease Father     Social History:  Social History   Socioeconomic History  . Marital status: Single    Spouse name: Not on file  . Number of children: Not on file  . Years of education: Not on file  . Highest education level: Not on file  Occupational History  . Not on file  Social Needs  . Financial resource strain: Not on file  . Food insecurity:    Worry: Not on file    Inability: Not on file  . Transportation needs:    Medical: Not on file    Non-medical: Not on file    Tobacco Use  . Smoking status: Current Every Day Smoker    Packs/day: 1.00  . Smokeless tobacco: Never Used  Substance and Sexual Activity  . Alcohol use: Yes  . Drug use: No  . Sexual activity: Yes  Lifestyle  . Physical activity:    Days per week: Not on file    Minutes per session: Not on file  . Stress: Not on file  Relationships  . Social connections:    Talks on phone: Not on file    Gets together: Not on file    Attends religious service: Not on file    Active member of club or organization: Not on file    Attends meetings of clubs or organizations: Not on file    Relationship status: Not on file  . Intimate partner violence:    Fear of current or ex partner: Not on file    Emotionally abused: Not on file    Physically abused: Not on file    Forced sexual activity: Not on file  Other Topics Concern  . Not on file  Social History Narrative  . Not on file    Allergies:  No Known Allergies  Medications: Prior to Admission medications   Medication Sig Start Date End Date Taking? Authorizing Provider  acetaminophen (TYLENOL)  500 MG tablet Take 500 mg by mouth every 6 (six) hours as needed.    [provider]  cyclobenzaprine (FLEXERIL) 10 MG tablet Take 1 tablet (10 mg total) by mouth 3 (three) times daily as needed. Patient not taking: Reported on 12/28/2017 04/17/16   Joni ReiningSmith, Ronald K, PA-C  fluticasone [place](FLONASE) 50 MCG/ACT nasal spray Place 2 sprays into both nostrils daily. 02/14/16 02/13/17  Tommi RumpsSummers, Rhonda L, PA-C  ibuprofen (ADVIL,MOTRIN) 600 MG tablet Take 1 tablet (600 mg total) by mouth every 8 (eight) hours as needed. Patient not taking: Reported on 12/28/2017 04/17/16   Joni ReiningSmith, Ronald K, PA-C  loratadine (CLARITIN) 10 MG tablet Take 1 tablet (10 mg total) by mouth daily. 02/14/16 02/13/17  Tommi RumpsSummers, Rhonda L, PA-C  naproxen (NAPROSYN) 500 MG tablet Take 1 tablet (500 mg total) by mouth 2 (two) times daily with a meal. Patient not taking: Reported on 12/28/2017  10/05/16   Lutricia Feiloemer, William P, PA-C    Physical Exam Vitals: Blood pressure 132/82, height 5\' 8"  (1.727 m), weight 194 lb (88 kg), last menstrual period 10/27/2017, not currently breastfeeding. General: NAD HEENT: normocephalic, anicteric Pulmonary: No increased work of breathing, Neurologic: Grossly intact Psychiatric: mood appropriate, affect full  Assessment: 22 y.o. G3P0030 Unknown presenting for evaluation of first trimester vaginal bleeding  Plan: Problem List Items Addressed This Visit    None    Visit Diagnoses    Recurrent pregnancy loss    -  Primary   Relevant Orders   Antithrombin III   Cardiolipin antibodies, IgG, IgM, IgA   Factor V Leiden   TSH   Beta-2-glycoprotein i abs, IgG/M/A   Hemoglobin A1c   Chemical pregnancy          1) First trimester bleeding - no documented positive HCG or UPT in ER.  Based on LMP this likely represented a chemical pregnancy that never underwent proper implantation.  However given her history of 2 prior first trimester losses we discussed work up for coagulopathies, DM, and thyroid issues to see if any modifiable factors can be identified prior to next pregnancy.  2) Definition of recurrent miscarriage, defined as 3 or more successive first trimester losses discussed in detail.  The most commonly identifiable etiology is antiphospholipid antibody syndrome (APS).  While APS is the only thrombophilia with a clearly proven association for first trimester miscarriage, other hypercoagulable disorders while showing a weak or inconsistent relationship to first trimester miscarriage are often times still tested for,  These include Factor V Leiden, MTHFR (homozygous), prothrombin gene mutation, Protein C and S deficiency.  Uterine structural lesions, endocrine factors (thyoid disease, diabetes, and PCOS), and disorders in parental karyotype has also been implicated.  Use of daily Asprin has been evaluated in the setting of recurrent pregnancy loss and  shows no clear benefit in patient without antiphospholipid antibody syndrome.  Approximately 50% of couple with recurrent miscarriage will not have a readily identifiable etiology  Several studies have examined and established a role for emotional support and stress reduction in improving pregnancy outcomes for patient with recurrent miscarriage. The tender love and care model utilizes weekly to twice weekly follow up, more frequent ultrasound, and has consistently demonstrated improved live birth rates in several studies.  The risk of miscarriage following documentation of fetal heart tones drops significantly to approximately 3%.  3) A total of 30 minutes were spent in face-to-face contact with the patient during this encounter with over half of that time devoted to counseling and coordination of  care.  4) Return in about 3 months (around 03/30/2018), or if symptoms worsen or fail to improve, for annual and follow up.     Vena Austria, MD, [facility], [facility] 12/28/2017, 10:17 PM

## 2018-01-26 ENCOUNTER — Telehealth: Payer: Self-pay

## 2018-01-26 NOTE — Telephone Encounter (Signed)
Hilda LiasMarie (pt's mom) calling to inquire about some tests patient is supposed to be having done. Cb#[phone]

## 2018-01-26 NOTE — Telephone Encounter (Signed)
Spoke w/Barbara Quinn (pt mom). She was clarifying that the patient does have insurance & testing was mentioned at last visit, but she wasn't sure what kind of testing. I notified per last visit note that lab work to evaluate for issues that may cause her difficulty becoming pregnant/maintaining pregnancy were discussed. Patient prefers to see same provider as last time. Notified apt was scheduled w/[facility]. Pt last saw [facility]. Apt r/s w/[facility] Monday 01/31/18 at 8:30. Mom states pt will call back if that date/time isn't suitable.

## 2018-01-31 ENCOUNTER — Encounter: Payer: Self-pay | Admitting: Obstetrics and Gynecology

## 2018-01-31 ENCOUNTER — Ambulatory Visit (INDEPENDENT_AMBULATORY_CARE_PROVIDER_SITE_OTHER): Payer: BLUE CROSS/BLUE SHIELD | Admitting: Obstetrics and Gynecology

## 2018-01-31 VITALS — BP 134/70 | HR 92 | Ht 68.0 in | Wt 199.0 lb

## 2018-01-31 DIAGNOSIS — N96 Recurrent pregnancy loss: Secondary | ICD-10-CM

## 2018-01-31 DIAGNOSIS — F411 Generalized anxiety disorder: Secondary | ICD-10-CM

## 2018-01-31 DIAGNOSIS — R300 Dysuria: Secondary | ICD-10-CM

## 2018-01-31 LAB — POCT URINALYSIS DIPSTICK
Bilirubin, UA: NEGATIVE
Blood, UA: POSITIVE
Glucose, UA: NEGATIVE
Ketones, UA: NEGATIVE
NITRITE UA: NEGATIVE
PROTEIN UA: POSITIVE — AB
SPEC GRAV UA: 1.015 (ref 1.010–1.025)
Urobilinogen, UA: NEGATIVE E.U./dL — AB
pH, UA: 7.5 (ref 5.0–8.0)

## 2018-01-31 MED ORDER — ESCITALOPRAM OXALATE 10 MG PO TABS
10.0000 mg | ORAL_TABLET | Freq: Every day | ORAL | 2 refills | Status: DC
Start: 2018-01-31 — End: 2018-02-22

## 2018-01-31 NOTE — Progress Notes (Signed)
Obstetrics & Gynecology Office Visit   Chief Complaint:  Chief Complaint  Patient presents with  . ER follow up    Headaches vision problems  . Pelvic Pain    recent miscarriage    History of Present Illness: 22 y.o. G3P0030 presenting for follow up of recurrent first trimester miscarriage.  We had previously discussed the laboratory work up for recurrent miscarriage.  While we discussed karyotype but the fact that diagnosis of a Robertsonian translocation while providing an answer for her miscarriages it does not present a modifiable risk factor.  Hypercoagulable work up, diabetes screening, and thyroid screening do however offer the potential to uncover modifiable risk factors.  Uterus has appeared normal at time of early pregnancy ultrasound but SIS is another potential avenue to pursue.  She is aware that majority of work usp end up unable to find a identifiable cause.    The patient is a 22 y.o. female presenting initial evaluation for symptoms of anxiety and depression, with anxiety being the predominant symptom.  The patient is currently taking nothing for the management of her symptoms.  She has had any recent situational stressors (currently moving, as well as miscarriages).  She reports symptoms of risk taking behavior, agorophobia, suicidal ideation, homicidal ideation, auditory hallucinations and visual hallucinations.  She denies anhedonia, insomnia, irritability, social anxiety and feelings of guilt.   The patient does not have a pre-existing history of depression and anxiety.  She  does not a prior history of suicide attempts.  Previous treatment: none  We discussed proceeding with management of her anxiety as I do not anticipate her next pregnancy will be an anxiety reducing event for her.  Review of Systems: Review of Systems  Constitutional: Negative.   Gastrointestinal: Negative.   Skin: Negative.   Neurological: Negative.   Psychiatric/Behavioral: Positive for  depression. Negative for hallucinations, memory loss, substance abuse and suicidal ideas. The patient is nervous/anxious and has insomnia.      Past Medical History:  Past Medical History:  Diagnosis Date  . Kidney stone   . Renal disorder   . Seizure (HCC)   . Seizures (HCC)     Past Surgical History:  Past Surgical History:  Procedure Laterality Date  . tubes in ears      Gynecologic History: Patient's last menstrual period was 01/13/2018 (exact date).  Obstetric History: G3P0030  Family History:  Family History  Problem Relation Age of Onset  . Depression Mother   . Heart attack Mother   . GER disease Mother   . GER disease Father     Social History:  Social History   Socioeconomic History  . Marital status: Single    Spouse name: Not on file  . Number of children: Not on file  . Years of education: Not on file  . Highest education level: Not on file  Occupational History  . Not on file  Social Needs  . Financial resource strain: Not on file  . Food insecurity:    Worry: Not on file    Inability: Not on file  . Transportation needs:    Medical: Not on file    Non-medical: Not on file  Tobacco Use  . Smoking status: Current Every Day Smoker    Packs/day: 1.00  . Smokeless tobacco: Never Used  Substance and Sexual Activity  . Alcohol use: Yes  . Drug use: No  . Sexual activity: Yes  Lifestyle  . Physical activity:    Days  per week: Not on file    Minutes per session: Not on file  . Stress: Not on file  Relationships  . Social connections:    Talks on phone: Not on file    Gets together: Not on file    Attends religious service: Not on file    Active member of club or organization: Not on file    Attends meetings of clubs or organizations: Not on file    Relationship status: Not on file  . Intimate partner violence:    Fear of current or ex partner: Not on file    Emotionally abused: Not on file    Physically abused: Not on file    Forced  sexual activity: Not on file  Other Topics Concern  . Not on file  Social History Narrative  . Not on file    Allergies:  No Known Allergies  Medications: Prior to Admission medications   Medication Sig Start Date End Date Taking? Authorizing Provider  acetaminophen (TYLENOL) 500 MG tablet Take 500 mg by mouth every 6 (six) hours as needed.   Yes [provider]  escitalopram (LEXAPRO) 10 MG tablet Take 1 tablet (10 mg total) by mouth daily. 01/31/18 01/31/19  Vena AustriaStaebler, Neiko Trivedi, MD  fluticasone (FLONASE) 50 MCG/ACT nasal spray Place 2 sprays into both nostrils daily. 02/14/16 02/13/17  Tommi RumpsSummers, Rhonda L, PA-C  loratadine (CLARITIN) 10 MG tablet Take 1 tablet (10 mg total) by mouth daily. 02/14/16 02/13/17  Tommi RumpsSummers, Rhonda L, PA-C    Physical Exam Vitals:  Vitals:   01/31/18 0846  BP: 134/70  Pulse: 92   Patient's last menstrual period was 01/13/2018 (exact date).  General: NAD HEENT: normocephalic, anicteric Pulmonary: No increased work of breathing Extremities: no edema, erythema, or tenderness Neurologic: Grossly intact Psychiatric: mood appropriate, affect full  Female chaperone present for pelvic  portions of the physical exam  Assessment: 22 y.o. G3P0030 with recurrent miscarriage as well as anxiety/depression  Plan: Problem List Items Addressed This Visit    None    Visit Diagnoses    History of recurrent miscarriages    -  Primary   Relevant Orders   Hemoglobin A1c   Beta-2-glycoprotein i abs, IgG/M/A   TSH   Factor V Leiden   Cardiolipin antibodies, IgG, IgM, IgA   Antithrombin III   Dysuria       Relevant Orders   Urine Culture   POCT urinalysis dipstick (Completed)   Generalized anxiety disorder       Relevant Medications   escitalopram (LEXAPRO) 10 MG tablet     1) Anxiety - GAD-7 of 15.  Some situational factors but in anticipation of next pregnancy discussed proceeding with pharmacotherapy. - start lexapro 10mg   2) UA in ER with positive  bacteria - urine culture sent  3) Recurrent miscarriage labs - basic recurrent miscarriage work up today.   Definition of recurrent miscarriage, defined as 3 or more successive first trimester losses discussed in detail.  The most commonly identifiable etiology is antiphospholipid antibody syndrome (APS).  While APS is the only thrombophilia with a clearly proven association for first trimester miscarriage, other hypercoagulable disorders while showing a weak or inconsistent relationship to first trimester miscarriage are often times still tested for,  These include Factor V Leiden, MTHFR (homozygous), prothrombin gene mutation, Protein C and S deficiency.  Uterine structural lesions, endocrine factors (thyoid disease, diabetes, and PCOS), and disorders in parental karyotype has also been implicated.  Use of daily Asprin has been  evaluated in the setting of recurrent pregnancy loss and shows no clear benefit in patient without antiphospholipid antibody syndrome.  Approximately 50% of couple with recurrent miscarriage will not have a readily identifiable etiology  Several studies have examined and established a role for emotional support and stress reduction in improving pregnancy outcomes for patient with recurrent miscarriage. The tender love and care model utilizes weekly to twice weekly follow up, more frequent ultrasound, and has consistently demonstrated improved live birth rates in several studies.  The risk of miscarriage following documentation of fetal heart tones drops significantly to approximately 3%.  3) A total of 25 minutes were spent in face-to-face contact with the patient during this encounter with over half of that time devoted to counseling and coordination of care.  4) Return in about 4 weeks (around 02/28/2018) for medication follow up.   Vena AustriaAndreas Kairi Tufo, MD, Merlinda FrederickFACOG [facility], [facility]

## 2018-02-03 ENCOUNTER — Ambulatory Visit: Payer: Medicaid Other | Admitting: Obstetrics & Gynecology

## 2018-02-03 LAB — URINE CULTURE

## 2018-02-08 LAB — BETA-2-GLYCOPROTEIN I ABS, IGG/M/A
Beta-2 Glyco 1 IgM: <9 GPI IgM units (ref 0–32)
Beta-2 Glyco I IgG: <9 GPI IgG units (ref 0–20)

## 2018-02-08 LAB — FACTOR V LEIDEN

## 2018-02-08 LAB — CARDIOLIPIN ANTIBODIES, IGG, IGM, IGA: Anticardiolipin IgG: <9 GPL U/mL (ref 0–14)

## 2018-02-08 LAB — TSH: TSH: 0.843 u[IU]/mL (ref 0.450–4.500)

## 2018-02-08 LAB — ANTITHROMBIN III: AntiThromb III Func: 93 % (ref 75–135)

## 2018-02-08 LAB — HEMOGLOBIN A1C
Est. average glucose Bld gHb Est-mCnc: 103 mg/dL
HEMOGLOBIN A1C: 5.2 % (ref 4.8–5.6)

## 2018-02-17 ENCOUNTER — Emergency Department
Admission: EM | Admit: 2018-02-17 | Discharge: 2018-02-17 | Disposition: A | Discharge status: Home / Self Care | Payer: BLUE CROSS/BLUE SHIELD | Attending: Emergency Medicine | Admitting: Emergency Medicine

## 2018-02-17 ENCOUNTER — Encounter: Payer: Self-pay | Admitting: Emergency Medicine

## 2018-02-17 DIAGNOSIS — R519 Headache, unspecified: Secondary | ICD-10-CM

## 2018-02-17 DIAGNOSIS — G44209 Tension-type headache, unspecified, not intractable: Secondary | ICD-10-CM | POA: Diagnosis not present

## 2018-02-17 DIAGNOSIS — R51 Headache: Secondary | ICD-10-CM

## 2018-02-17 DIAGNOSIS — M25561 Pain in right knee: Secondary | ICD-10-CM | POA: Diagnosis not present

## 2018-02-17 DIAGNOSIS — F17210 Nicotine dependence, cigarettes, uncomplicated: Secondary | ICD-10-CM | POA: Insufficient documentation

## 2018-02-17 DIAGNOSIS — Z79899 Other long term (current) drug therapy: Secondary | ICD-10-CM | POA: Insufficient documentation

## 2018-02-17 MED ORDER — ONDANSETRON 4 MG PO TBDP
4.0000 mg | ORAL_TABLET | Freq: Once | ORAL | Status: AC
  Administered 2018-02-17: 4 mg via ORAL
  Filled 2018-02-17: qty 1

## 2018-02-17 MED ORDER — ONDANSETRON HCL 4 MG PO TABS: 4.0000 mg | ORAL_TABLET | Freq: Three times a day (TID) | ORAL | 0 refills | Status: DC | PRN

## 2018-02-17 MED ORDER — IBUPROFEN 800 MG PO TABS
800.0000 mg | ORAL_TABLET | Freq: Once | ORAL | Status: AC
  Administered 2018-02-17: 800 mg via ORAL
  Filled 2018-02-17: qty 1

## 2018-02-17 NOTE — ED Provider Notes (Signed)
[facility] Provider Note ____________________________________________   I have reviewed the triage vital signs and the triage nursing note.  HISTORY  Chief Complaint Headache and Knee Pain   Historian Patient  [name] is a 22 y.o. female reports history of "migraines "states that she gets headaches about once a week which are mild and migraine or bad headaches about once a month.  She states that she has had a CT scan of the head which was normal in the past.  States she does not have a primary care doctor right now.  States that she typically works 6 AM to 2 PM.  This headache started yesterday and lasted all night which is why she came to the ER.  Pain was severe but currently is mild down to 4 out of 10.  No vision changes.  No weakness or numbness.  No incontinence.  No altered mental status.     Past Medical History:  Diagnosis Date  . Kidney stone   . Renal disorder   . Seizure (HCC)   . Seizures (HCC)     There are no active problems to display for this patient.   Past Surgical History:  Procedure Laterality Date  . tubes in ears      Prior to Admission medications   Medication Sig Start Date End Date Taking? Authorizing Provider  acetaminophen (TYLENOL) 500 MG tablet Take 500 mg by mouth every 6 (six) hours as needed.    [provider]  escitalopram (LEXAPRO) 10 MG tablet Take 1 tablet (10 mg total) by mouth daily. 01/31/18 01/31/19  Vena Austria, MD  fluticasone (FLONASE) 50 MCG/ACT nasal spray Place 2 sprays into both nostrils daily. 02/14/16 02/13/17  Tommi Rumps, PA-C  loratadine (CLARITIN) 10 MG tablet Take 1 tablet (10 mg total) by mouth daily. 02/14/16 02/13/17  Tommi Rumps, PA-C  ondansetron (ZOFRAN) 4 MG tablet Take 1 tablet (4 mg total) by mouth every 8 (eight) hours as needed for nausea or vomiting. 02/17/18   Barbara Rooks, MD    No Known Allergies  Family History   Problem Relation Age of Onset  . Depression Mother   . Heart attack Mother   . GER disease Mother   . GER disease Father     Social History Social History   Tobacco Use  . Smoking status: Current Every Day Smoker    Packs/day: 1.00  . Smokeless tobacco: Never Used  Substance Use Topics  . Alcohol use: Yes  . Drug use: No    Review of Systems  Constitutional: Negative for fever. Eyes: Negative for visual changes. ENT: Negative for sore throat. Cardiovascular: Negative for chest pain. Respiratory: Negative for shortness of breath. Gastrointestinal: Negative for abdominal pain, vomiting and diarrhea. Genitourinary: Negative for dysuria. Musculoskeletal: Negative for back pain. Skin: Negative for rash. Neurological: As of as per HPI for headache.  ____________________________________________   PHYSICAL EXAM:  VITAL SIGNS: ED Triage Vitals  Enc Vitals Group     BP 02/17/18 0633 131/81     Pulse Rate 02/17/18 0633 78     Resp 02/17/18 0633 18     Temp 02/17/18 0633 97.8 F (36.6 C)     Temp Source 02/17/18 0633 Oral     SpO2 02/17/18 0633 98 %     Weight 02/17/18 0623 180 lb (81.6 kg)     Height 02/17/18 0623 5\' 8"  (1.727 m)     Head Circumference --  Peak Flow --      Pain Score 02/17/18 0623 8     Pain Loc --      Pain Edu? --      Excl. in GC? --      Constitutional: Alert and oriented.  HEENT      Head: Normocephalic and atraumatic.      Eyes: Conjunctivae are slightly injected bilaterally. Pupils equal and round.  Funduscopic exam normal bilaterally.      Ears:         Nose: No congestion/rhinnorhea.      Mouth/Throat: Mucous membranes are moist.      Neck: No stridor. Cardiovascular/Chest: Normal rate, regular rhythm.  No murmurs, rubs, or gallops. Respiratory: Normal respiratory effort without tachypnea nor retractions. Breath sounds are clear and equal bilaterally. No wheezes/rales/rhonchi. Gastrointestinal: Soft. No distention, no guarding,  no rebound. Nontender.    Genitourinary/rectal:Deferred Musculoskeletal: Nontender with normal range of motion in all extremities. No joint effusions.  No lower extremity muscular tenderness.  No edema.  Reports mild right-sided knee pain. Neurologic:  Normal speech and language. No gross or focal neurologic deficits are appreciated. Skin:  Skin is warm, dry and intact. No rash noted. Psychiatric: Mood and affect are normal. Speech and behavior are normal. Patient exhibits appropriate insight and judgment.   ____________________________________________  LABS (pertinent positives/negatives) I, Barbara Rooks, MD the attending physician have reviewed the labs noted below.  Labs Reviewed - No data to display  ____________________________________________    EKG I, Barbara Rooks, MD, the attending physician have personally viewed and interpreted all ECGs.  None ____________________________________________  RADIOLOGY   None __________________________________________  PROCEDURES  Procedure(s) performed: None  Procedures  Critical Care performed: None   ____________________________________________  ED COURSE / ASSESSMENT AND PLAN  Pertinent labs & imaging results that were available during my care of the patient were reviewed by me and considered in my medical decision making (see chart for details).    Patient came initially for headache but the headache is much better and improved now.  There are no neurologic symptoms or findings by history or exam.  We discussed whether she would like to do IV headache treatment or p.o. medications.  She reported she would like to take p.o. medications and go home.  I am to give her ibuprofen and Zofran here.  We discussed trying Benadryl with these other medications at onset of headache.  No indication for additional or advanced imaging at this point time  In terms of the right knee complaint, it sounds like this is been chronic and  she thinks is arthritis.  There is no joint effusion.  There is no trauma.  I am not suspicious of medical orthopedic emergency.    CONSULTATIONS:   None   Patient / Family / Caregiver informed of clinical course, medical decision-making process, and agree with plan.   I discussed return precautions, follow-up instructions, and discharge instructions with patient and/or family.  Discharge Instructions : You are evaluated for headache, and although no certain cause was found, your exam and evaluation are overall reassuring in the emergency department today.  As we discussed, I do want you to follow-up with primary care doctor and you are given to office numbers.  Return to the emergency department immediately for any worsening or uncontrolled headache, any confusion altered mental status, weakness, numbness, incontinence which is peeing or pooping on yourself, fever, or any other symptoms concerning to you.  At onset of headache you may try  800 mg of over-the-counter ibuprofen, 25 mg of over-the-counter Benadryl, and 1 tablet Zofran prescription.    ___________________________________________   FINAL CLINICAL IMPRESSION(S) / ED DIAGNOSES   Final diagnoses:  Acute nonintractable headache, unspecified headache type  Right knee pain, unspecified chronicity      ___________________________________________         Note: This dictation was prepared with Dragon dictation. Any transcriptional errors that result from this process are unintentional    Barbara Rooks, MD 02/17/18 [id number]

## 2018-02-17 NOTE — Discharge Instructions (Signed)
You are evaluated for headache, and although no certain cause was found, your exam and evaluation are overall reassuring in the emergency department today.  As we discussed, I do want you to follow-up with primary care doctor and you are given to office numbers.  Return to the emergency department immediately for any worsening or uncontrolled headache, any confusion altered mental status, weakness, numbness, incontinence which is peeing or pooping on yourself, fever, or any other symptoms concerning to you.  At onset of headache you may try 800 mg of over-the-counter ibuprofen, 25 mg of over-the-counter Benadryl, and 1 tablet Zofran prescription.

## 2018-02-17 NOTE — ED Triage Notes (Signed)
Patient with complaint of intermittent headache times one week and has taken tylenol with no improvement. Patient states that her right knee has been locking up times one week.

## 2018-02-22 ENCOUNTER — Other Ambulatory Visit: Payer: Self-pay | Admitting: Obstetrics and Gynecology

## 2018-02-22 NOTE — Telephone Encounter (Signed)
Pt has follow up with [facility] on 9/16. Please advise if 90 day supply is appropriate

## 2018-02-28 ENCOUNTER — Encounter: Payer: Self-pay | Admitting: Obstetrics and Gynecology

## 2018-02-28 ENCOUNTER — Ambulatory Visit (INDEPENDENT_AMBULATORY_CARE_PROVIDER_SITE_OTHER): Payer: BLUE CROSS/BLUE SHIELD | Admitting: Obstetrics and Gynecology

## 2018-02-28 VITALS — BP 134/88 | HR 96 | Ht 68.0 in | Wt 197.0 lb

## 2018-02-28 DIAGNOSIS — F329 Major depressive disorder, single episode, unspecified: Secondary | ICD-10-CM | POA: Diagnosis not present

## 2018-02-28 DIAGNOSIS — F419 Anxiety disorder, unspecified: Secondary | ICD-10-CM

## 2018-02-28 MED ORDER — ESCITALOPRAM OXALATE 20 MG PO TABS: 20.0000 mg | ORAL_TABLET | Freq: Every day | ORAL | 2 refills | Status: DC

## 2018-02-28 NOTE — Progress Notes (Signed)
Obstetrics & Gynecology Office Visit   Chief Complaint:  Chief Complaint  Patient presents with  . Medication follow up    History of Present Illness: The patient is a 22 y.o. female presenting follow up for symptoms of anxiety and depression.  The patient is currently taking Lexapro 10mg  for the management of her symptoms.  She has not had any recent situational stressors.  She reports symptoms of anhedonia, day time somnolence, insomnia and feelings of guilt.  She denies risk taking behavior, social anxiety, agorophobia, suicidal ideation, homicidal ideation, auditory hallucinations and visual hallucinations. Symptoms have improved since last visit.     The patient does have a pre-existing history of depression and anxiety.  She  does not a prior history of suicide attempts.    Review of Systems:Review of Systems  Constitutional: Negative.   Gastrointestinal: Negative for abdominal pain and nausea.  Neurological: Negative for headaches.  Psychiatric/Behavioral: Positive for depression. Negative for hallucinations, memory loss, substance abuse and suicidal ideas. The patient is nervous/anxious and has insomnia.      Past Medical History:  Past Medical History:  Diagnosis Date  . Kidney stone   . Renal disorder   . Seizure (HCC)   . Seizures (HCC)     Past Surgical History:  Past Surgical History:  Procedure Laterality Date  . tubes in ears      Gynecologic History: Patient's last menstrual period was 02/12/2018 (exact date).  Obstetric History: G3P0030  Family History:  Family History  Problem Relation Age of Onset  . Depression Mother   . Heart attack Mother   . GER disease Mother   . GER disease Father     Social History:  Social History   Socioeconomic History  . Marital status: Single    Spouse name: Not on file  . Number of children: Not on file  . Years of education: Not on file  . Highest education level: Not on file  Occupational History  .  Not on file  Social Needs  . Financial resource strain: Not on file  . Food insecurity:    Worry: Not on file    Inability: Not on file  . Transportation needs:    Medical: Not on file    Non-medical: Not on file  Tobacco Use  . Smoking status: Current Every Day Smoker    Packs/day: 1.00  . Smokeless tobacco: Never Used  Substance and Sexual Activity  . Alcohol use: Yes  . Drug use: No  . Sexual activity: Yes  Lifestyle  . Physical activity:    Days per week: Not on file    Minutes per session: Not on file  . Stress: Not on file  Relationships  . Social connections:    Talks on phone: Not on file    Gets together: Not on file    Attends religious service: Not on file    Active member of club or organization: Not on file    Attends meetings of clubs or organizations: Not on file    Relationship status: Not on file  . Intimate partner violence:    Fear of current or ex partner: Not on file    Emotionally abused: Not on file    Physically abused: Not on file    Forced sexual activity: Not on file  Other Topics Concern  . Not on file  Social History Narrative  . Not on file    Allergies:  No Known Allergies  Medications:  Prior to Admission medications   Medication Sig Start Date End Date Taking? Authorizing Provider  acetaminophen (TYLENOL) 500 MG tablet Take 500 mg by mouth every 6 (six) hours as needed.   Yes [provider]  escitalopram (LEXAPRO) 10 MG tablet TAKE 1 TABLET BY MOUTH EVERY DAY 02/22/18  Yes Vena AustriaStaebler, Orlanda Lemmerman, MD    Physical Exam Vitals:  Vitals:   02/28/18 0838  BP: 134/88  Pulse: 96   Patient's last menstrual period was 02/12/2018 (exact date).  General: NAD HEENT: normocephalic, anicteric Pulmonary: No increased work of breathing Neurologic: Grossly intact Psychiatric: mood appropriate, affect full  Assessment: 22 y.o. G3P0030 follow up anxiety and depression  Plan: Problem List Items Addressed This Visit    None      Visit Diagnoses    Anxiety and depression    -  Primary   Relevant Medications   escitalopram (LEXAPRO) 20 MG tablet     1) GAD-7 down to 6 from 15, PHQ-9 is 8 with item 9 answered as 0.  Overall improvement in symptoms.  Will increase Lexapro to 20mg  daily.  2) A total of 15 minutes were spent in face-to-face contact with the patient during this encounter with over half of that time devoted to counseling and coordination of care.  3) Return in about 4 weeks (around 03/28/2018) for medication follow up.   Vena AustriaAndreas Arline Ketter, MD, Merlinda FrederickFACOG [facility], [facility] 02/28/2018, 8:56 AM

## 2018-03-14 ENCOUNTER — Other Ambulatory Visit: Payer: Self-pay | Admitting: Obstetrics and Gynecology

## 2018-03-15 ENCOUNTER — Other Ambulatory Visit: Payer: Self-pay | Admitting: Obstetrics and Gynecology

## 2018-03-15 NOTE — Telephone Encounter (Signed)
I wasn't sure if I could ok a 90 day supply on this medication. Please advise

## 2018-03-28 ENCOUNTER — Ambulatory Visit: Payer: BLUE CROSS/BLUE SHIELD | Admitting: Obstetrics and Gynecology

## 2018-04-08 ENCOUNTER — Other Ambulatory Visit: Payer: Self-pay

## 2018-04-08 ENCOUNTER — Emergency Department
Admission: EM | Admit: 2018-04-08 | Discharge: 2018-04-08 | Disposition: A | Discharge status: Home / Self Care | Payer: BLUE CROSS/BLUE SHIELD | Attending: Emergency Medicine | Admitting: Emergency Medicine

## 2018-04-08 ENCOUNTER — Encounter: Payer: Self-pay | Admitting: Emergency Medicine

## 2018-04-08 DIAGNOSIS — O2340 Unspecified infection of urinary tract in pregnancy, unspecified trimester: Secondary | ICD-10-CM | POA: Diagnosis not present

## 2018-04-08 DIAGNOSIS — Z79899 Other long term (current) drug therapy: Secondary | ICD-10-CM | POA: Diagnosis not present

## 2018-04-08 DIAGNOSIS — R300 Dysuria: Secondary | ICD-10-CM | POA: Diagnosis present

## 2018-04-08 DIAGNOSIS — Z3201 Encounter for pregnancy test, result positive: Secondary | ICD-10-CM | POA: Diagnosis not present

## 2018-04-08 DIAGNOSIS — N760 Acute vaginitis: Secondary | ICD-10-CM

## 2018-04-08 DIAGNOSIS — N390 Urinary tract infection, site not specified: Secondary | ICD-10-CM

## 2018-04-08 DIAGNOSIS — F17200 Nicotine dependence, unspecified, uncomplicated: Secondary | ICD-10-CM | POA: Insufficient documentation

## 2018-04-08 DIAGNOSIS — Z3A00 Weeks of gestation of pregnancy not specified: Secondary | ICD-10-CM | POA: Insufficient documentation

## 2018-04-08 DIAGNOSIS — B9689 Other specified bacterial agents as the cause of diseases classified elsewhere: Secondary | ICD-10-CM

## 2018-04-08 LAB — URINALYSIS, COMPLETE (UACMP) WITH MICROSCOPIC
BILIRUBIN URINE: NEGATIVE
GLUCOSE, UA: NEGATIVE mg/dL
Ketones, ur: NEGATIVE mg/dL
NITRITE: POSITIVE — AB
Protein, ur: NEGATIVE mg/dL
Specific Gravity, Urine: 1.018 (ref 1.005–1.030)
pH: 5 (ref 5.0–8.0)

## 2018-04-08 LAB — POCT PREGNANCY, URINE: Preg Test, Ur: POSITIVE — AB

## 2018-04-08 LAB — WET PREP, GENITAL
CLUE CELLS WET PREP: POSITIVE — AB
Sperm: NONE SEEN
TRICH WET PREP: NONE SEEN
Yeast Wet Prep HPF POC: NONE SEEN

## 2018-04-08 LAB — CHLAMYDIA/NGC RT PCR (ARMC ONLY)
Chlamydia Tr: NOT DETECTED
N GONORRHOEAE: NOT DETECTED

## 2018-04-08 LAB — HCG, QUANTITATIVE, PREGNANCY: HCG, BETA CHAIN, QUANT, S: 215 m[IU]/mL — AB (ref <5)

## 2018-04-08 MED ORDER — NITROFURANTOIN MONOHYD MACRO 100 MG PO CAPS: 100.0000 mg | ORAL_CAPSULE | Freq: Two times a day (BID) | ORAL | 0 refills | Status: DC

## 2018-04-08 MED ORDER — METRONIDAZOLE 500 MG PO TABS: 500.0000 mg | ORAL_TABLET | Freq: Three times a day (TID) | ORAL | 0 refills | Status: DC

## 2018-04-08 NOTE — ED Triage Notes (Signed)
Patient to ER for c/o dysuria x3 days. Denies any known fevers.

## 2018-04-08 NOTE — ED Notes (Signed)
Pt reports she seen her PCP 2 wks and was dx with UTI. PCP gave pt no meds. Pt states," Itchy burning while I pee." Pt awaiting EDP, NAD.

## 2018-04-08 NOTE — ED Provider Notes (Signed)
[facility] Emergency Department Provider Note  ____________________________________________   First MD Initiated Contact with Patient 04/08/18 [phone]     (approximate)  I have reviewed the triage vital signs and the nursing notes.   HISTORY  Chief Complaint Dysuria    [name] is a 22 y.o. female presents to the emergency department complaining of UTI symptoms for approximately 2 weeks with some vaginal itching.  She states that it itches and burns when she pees.  She denies any fever, chills, nausea, vomiting or vaginal discharge.  She is married and has one partner.  She states they have been trying to get pregnant.    Past Medical History:  Diagnosis Date  . Kidney stone   . Renal disorder   . Seizure (HCC)   . Seizures (HCC)     There are no active problems to display for this patient.   Past Surgical History:  Procedure Laterality Date  . tubes in ears      Prior to Admission medications   Medication Sig Start Date End Date Taking? Authorizing Provider  acetaminophen (TYLENOL) 500 MG tablet Take 500 mg by mouth every 6 (six) hours as needed.    [provider]  escitalopram (LEXAPRO) 20 MG tablet TAKE 1 TABLET BY MOUTH EVERY DAY 03/16/18   Vena Austria, MD  metroNIDAZOLE (FLAGYL) 500 MG tablet Take 1 tablet (500 mg total) by mouth 3 (three) times daily. 04/08/18   Briyonna Omara, Roselyn Bering, PA-C  nitrofurantoin, macrocrystal-monohydrate, (MACROBID) 100 MG capsule Take 1 capsule (100 mg total) by mouth 2 (two) times daily. 04/08/18   Faythe Ghee, PA-C    Allergies Patient has no known allergies.  Family History  Problem Relation Age of Onset  . Depression Mother   . Heart attack Mother   . GER disease Mother   . GER disease Father     Social History Social History   Tobacco Use  . Smoking status: Current Every Day Smoker    Packs/day: 1.00  . Smokeless tobacco: Never Used  Substance Use Topics  . Alcohol  use: Yes  . Drug use: No    Review of Systems  Constitutional: No fever/chills Eyes: No visual changes. ENT: No sore throat. Respiratory: Denies cough Genitourinary; positive for dysuria and for vaginal itching. Musculoskeletal: Negative for back pain. Skin: Negative for rash.    ____________________________________________   PHYSICAL EXAM:  VITAL SIGNS: ED Triage Vitals [04/08/18 0616]  Enc Vitals Group     BP 131/81     Pulse Rate 92     Resp 18     Temp 97.7 F (36.5 C)     Temp Source Oral     SpO2 98 %     Weight 180 lb (81.6 kg)     Height 5\' 8"  (1.727 m)     Head Circumference      Peak Flow      Pain Score 0     Pain Loc      Pain Edu?      Excl. in GC?     Constitutional: Alert and oriented. Well appearing and in no acute distress. Eyes: Conjunctivae are normal.  Head: Atraumatic. Nose: No congestion/rhinnorhea. Mouth/Throat: Mucous membranes are moist.   Neck:  supple no lymphadenopathy noted Cardiovascular: Normal rate, regular rhythm. Heart sounds are normal Respiratory: Normal respiratory effort.  No retractions, lungs c t a  Abd: soft nontender bs normal all 4 quad GU: External vaginal exam  does not show any herpetic lesions.  There is a whitish to clear discharge in the vaginal vault.  Cervix is normal.  No vaginal bleeding is noted. Musculoskeletal: FROM all extremities, warm and well perfused Neurologic:  Normal speech and language.  Skin:  Skin is warm, dry and intact. No rash noted. Psychiatric: Mood and affect are normal. Speech and behavior are normal.  ____________________________________________   LABS (all labs ordered are listed, but only abnormal results are displayed)  Labs Reviewed  WET PREP, GENITAL - Abnormal; Notable for the following components:      Result Value   Clue Cells Wet Prep HPF POC POSITIVE (*)    WBC, Wet Prep HPF POC MODERATE (*)    All other components within normal limits  URINALYSIS, COMPLETE (UACMP)  WITH MICROSCOPIC - Abnormal; Notable for the following components:   Color, Urine YELLOW (*)    APPearance HAZY (*)    Hgb urine dipstick SMALL (*)    Nitrite POSITIVE (*)    Leukocytes, UA MODERATE (*)    Bacteria, UA RARE (*)    All other components within normal limits  HCG, QUANTITATIVE, PREGNANCY - Abnormal; Notable for the following components:   hCG, Beta Chain, Quant, S 215 (*)    All other components within normal limits  POCT PREGNANCY, URINE - Abnormal; Notable for the following components:   Preg Test, Ur POSITIVE (*)    All other components within normal limits  CHLAMYDIA/NGC RT PCR ([facility] ONLY)  URINE CULTURE  POC URINE PREG, ED   ____________________________________________   ____________________________________________  RADIOLOGY    ____________________________________________   PROCEDURES  Procedure(s) performed: No  Procedures    ____________________________________________   INITIAL IMPRESSION / ASSESSMENT AND PLAN / ED COURSE  Pertinent labs & imaging results that were available during my care of the patient were reviewed by me and considered in my medical decision making (see chart for details).   Patient is 22 year old female presents emergency department complaining of UTI symptoms for approximately 2 weeks with some vaginal itching.  She is also trying to get pregnant.  Physical exam patient appears well.  She is afebrile.  Vaginal exam shows a white to clear vaginal discharge.  Urine pregnant is positive UA is positive for nitrites and moderate leuks Beta hCG is 215  Wet prep and GC/chlamydia test were obtained on the pelvic exam.  The patient does have a fishy odor.  ----------------------------------------- 8:27 AM on 04/08/2018 -----------------------------------------  Wet prep positive clue cells and WBCs.  GC/chlamydia is still pending.  Patient was discharged with a prescription for Macrobid and Flagyl.  She is to  follow-up with [facility] as this is her regular doctor.  She is to tell them she had a positive pregnancy test here in the emergency department.  She was given basic instructions on first trimester pregnancy such as eating healthy, taking prenatal pills, and to stop smoking/using alcohol.  She states she understands and will comply.  She was discharged in stable condition.     As part of my medical decision making, I reviewed the following data within the electronic MEDICAL RECORD NUMBER Nursing notes reviewed and incorporated, Labs reviewed POC urine pregnant positive, beta hCG is 215 which is positive, UA positive nitrites and leuks, wet prep positive clue cells and white blood cells, Old chart reviewed, Notes from prior ED visits and [place] Controlled Substance Database  ____________________________________________   FINAL CLINICAL IMPRESSION(S) / ED DIAGNOSES  Final diagnoses:  Acute UTI  Positive pregnancy test  BV (bacterial vaginosis)      NEW MEDICATIONS STARTED DURING THIS VISIT:  New Prescriptions   METRONIDAZOLE (FLAGYL) 500 MG TABLET    Take 1 tablet (500 mg total) by mouth 3 (three) times daily.   NITROFURANTOIN, MACROCRYSTAL-MONOHYDRATE, (MACROBID) 100 MG CAPSULE    Take 1 capsule (100 mg total) by mouth 2 (two) times daily.     Note:  This document was prepared using Dragon voice recognition software and may include unintentional dictation errors.    Faythe Ghee, PA-C 04/08/18 1610    Dionne Bucy, MD 04/08/18 [phone]

## 2018-04-08 NOTE — Discharge Instructions (Addendum)
Follow-up with [facility].  Please call them for an appointment.  Tell them you had a positive pregnancy test emergency department.  Take the medication for the UTI and bacterial vaginosis as instructed.  Take over-the-counter prenatal pills.  Return if you have any abdominal pain cramping, or bleeding.

## 2018-04-10 LAB — URINE CULTURE: Culture: >=100000 — AB

## 2018-04-13 ENCOUNTER — Encounter: Payer: Self-pay | Admitting: Obstetrics and Gynecology

## 2018-04-13 ENCOUNTER — Ambulatory Visit (INDEPENDENT_AMBULATORY_CARE_PROVIDER_SITE_OTHER): Payer: BLUE CROSS/BLUE SHIELD | Admitting: Obstetrics and Gynecology

## 2018-04-13 VITALS — BP 134/66 | HR 106 | Ht 68.0 in | Wt 207.0 lb

## 2018-04-13 DIAGNOSIS — F418 Other specified anxiety disorders: Secondary | ICD-10-CM

## 2018-04-13 DIAGNOSIS — Z3689 Encounter for other specified antenatal screening: Secondary | ICD-10-CM

## 2018-04-13 MED ORDER — ESCITALOPRAM OXALATE 20 MG PO TABS: 20.0000 mg | ORAL_TABLET | Freq: Every day | ORAL | 3 refills | Status: DC

## 2018-04-13 NOTE — Progress Notes (Signed)
Obstetrics & Gynecology Office Visit   Chief Complaint:  Chief Complaint  Patient presents with  . Follow-up    Anxirty Depression  . Positive pregnancy    Recent ER visit    History of Present Illness: The patient is a 22 y.o. female presenting follow up for symptoms of anxiety and depression.  The patient is currently taking lexapro 20mg  for the management of her symptoms.  She has not had any recent situational stressors.  She reports resolution of symptoms after dose increase on lexapro. She denies anhedonia, day time somnolence, insomnia, risk taking behavior, irritability, social anxiety, agorophobia, feelings of guilt, feelings of worthlessness, suicidal ideation, homicidal ideation, auditory hallucinations and visual hallucinations. Symptoms have improved since last visit.      In the interim she has found out that she is currently pregnant.    Review of Systems: Review of Systems  Constitutional: Negative.   Gastrointestinal: Negative for abdominal pain, nausea and vomiting.  Genitourinary: Negative.   Neurological: Negative for headaches.  Psychiatric/Behavioral: Negative.      Past Medical History:  Past Medical History:  Diagnosis Date  . Kidney stone   . Renal disorder   . Seizure (HCC)   . Seizures (HCC)     Past Surgical History:  Past Surgical History:  Procedure Laterality Date  . tubes in ears      Gynecologic History: Patient's last menstrual period was 03/08/2018 (exact date).  Obstetric History: G21P0030  Family History:  Family History  Problem Relation Age of Onset  . Depression Mother   . Heart attack Mother   . GER disease Mother   . GER disease Father     Social History:  Social History   Socioeconomic History  . Marital status: Single    Spouse name: Not on file  . Number of children: Not on file  . Years of education: Not on file  . Highest education level: Not on file  Occupational History  . Not on file  Social Needs   . Financial resource strain: Not on file  . Food insecurity:    Worry: Not on file    Inability: Not on file  . Transportation needs:    Medical: Not on file    Non-medical: Not on file  Tobacco Use  . Smoking status: Current Every Day Smoker    Packs/day: 1.00  . Smokeless tobacco: Never Used  Substance and Sexual Activity  . Alcohol use: Yes  . Drug use: No  . Sexual activity: Yes  Lifestyle  . Physical activity:    Days per week: Not on file    Minutes per session: Not on file  . Stress: Not on file  Relationships  . Social connections:    Talks on phone: Not on file    Gets together: Not on file    Attends religious service: Not on file    Active member of club or organization: Not on file    Attends meetings of clubs or organizations: Not on file    Relationship status: Not on file  . Intimate partner violence:    Fear of current or ex partner: Not on file    Emotionally abused: Not on file    Physically abused: Not on file    Forced sexual activity: Not on file  Other Topics Concern  . Not on file  Social History Narrative  . Not on file    Allergies:  No Known Allergies  Medications: Prior  to Admission medications   Medication Sig Start Date End Date Taking? Authorizing Provider  escitalopram (LEXAPRO) 20 MG tablet Take 1 tablet (20 mg total) by mouth daily. 04/13/18  Yes Vena Austria, MD  metroNIDAZOLE (FLAGYL) 500 MG tablet Take 1 tablet (500 mg total) by mouth 3 (three) times daily. 04/08/18  Yes Fisher, Roselyn Bering, PA-C  nitrofurantoin, macrocrystal-monohydrate, (MACROBID) 100 MG capsule Take 1 capsule (100 mg total) by mouth 2 (two) times daily. 04/08/18  Yes Fisher, Roselyn Bering, PA-C  acetaminophen (TYLENOL) 500 MG tablet Take 500 mg by mouth every 6 (six) hours as needed.    [provider]    Physical Exam Vitals:  Vitals:   04/13/18 1127  BP: 134/66  Pulse: (!) 106   Patient's last menstrual period was 03/08/2018 (exact  date).  General: NAD HEENT: normocephalic, anicteric Pulmonary: No increased work of breathing Neurologic: Grossly intact Psychiatric: mood appropriate, affect full    GAD 7 : Generalized Anxiety Score 04/13/2018 02/28/2018 01/31/2018  Nervous, Anxious, on Edge 1 1 3   Control/stop worrying 1 1 3   Worry too much - different things 0 1 3  Trouble relaxing 0 1 2  Restless 0 1 3  Easily annoyed or irritable 0 1 3  Afraid - awful might happen 0 0 2  Total GAD 7 Score 2 6 19   Anxiety Difficulty Somewhat difficult Somewhat difficult Very difficult    Depression screen [facility] 2/9 04/13/2018 02/28/2018  Decreased Interest 0 1  Down, Depressed, Hopeless 0 1  PHQ - 2 Score 0 2  Altered sleeping 1 2  Tired, decreased energy 1 2  Change in appetite 0 0  Feeling bad or failure about yourself  0 1  Trouble concentrating 0 0  Moving slowly or fidgety/restless 0 1  Suicidal thoughts 0 0  PHQ-9 Score 2 8  Difficult doing work/chores Somewhat difficult Somewhat difficult    Depression screen [facility] 2/9 04/13/2018 02/28/2018  Decreased Interest 0 1  Down, Depressed, Hopeless 0 1  PHQ - 2 Score 0 2  Altered sleeping 1 2  Tired, decreased energy 1 2  Change in appetite 0 0  Feeling bad or failure about yourself  0 1  Trouble concentrating 0 0  Moving slowly or fidgety/restless 0 1  Suicidal thoughts 0 0  PHQ-9 Score 2 8  Difficult doing work/chores Somewhat difficult Somewhat difficult     Assessment: 22 y.o. G4P0030 follow up medication for depression and anxiety with recent positive UPT  Plan: Problem List Items Addressed This Visit    None    Visit Diagnoses    Depression with anxiety    -  Primary   Relevant Medications   escitalopram (LEXAPRO) 20 MG tablet   Establish gestational age, ultrasound       Relevant Orders   US OB Comp Less 14 Wks      1) Anxiety/depression - continue lexapro 20mg .  We discussed safety and efficacy of SSRI's in pregnancy, recommendation for  continuation at present.    2) Dating scan next week with NOB.  Initial HCG 241mIU/mL on 04/08/18.  Given samples of PNV.  Basic food safety discussed.    3) A total of 15 minutes were spent in face-to-face contact with the patient during this encounter with over half of that time devoted to counseling and coordination of care.  4) Return in about 1 week (around 04/20/2018) for NOB and dating scan.    Vena Austria, MD, [facility], [facility] 04/13/2018, 12:41 PM

## 2018-04-25 ENCOUNTER — Encounter: Payer: Self-pay | Admitting: Obstetrics & Gynecology

## 2018-04-25 ENCOUNTER — Other Ambulatory Visit (HOSPITAL_COMMUNITY)
Admission: RE | Admit: 2018-04-25 | Discharge: 2018-04-25 | Disposition: A | Discharge status: Home / Self Care | Payer: BLUE CROSS/BLUE SHIELD | Source: Ambulatory Visit | Attending: Obstetrics & Gynecology | Admitting: Obstetrics & Gynecology

## 2018-04-25 ENCOUNTER — Other Ambulatory Visit: Payer: Self-pay | Admitting: Obstetrics and Gynecology

## 2018-04-25 ENCOUNTER — Ambulatory Visit (INDEPENDENT_AMBULATORY_CARE_PROVIDER_SITE_OTHER): Payer: BLUE CROSS/BLUE SHIELD

## 2018-04-25 ENCOUNTER — Ambulatory Visit (INDEPENDENT_AMBULATORY_CARE_PROVIDER_SITE_OTHER): Payer: BLUE CROSS/BLUE SHIELD | Admitting: Obstetrics & Gynecology

## 2018-04-25 VITALS — BP 120/80 | Wt 202.0 lb

## 2018-04-25 DIAGNOSIS — Z3491 Encounter for supervision of normal pregnancy, unspecified, first trimester: Secondary | ICD-10-CM

## 2018-04-25 DIAGNOSIS — Z3689 Encounter for other specified antenatal screening: Secondary | ICD-10-CM

## 2018-04-25 DIAGNOSIS — Z124 Encounter for screening for malignant neoplasm of cervix: Secondary | ICD-10-CM | POA: Diagnosis not present

## 2018-04-25 DIAGNOSIS — N96 Recurrent pregnancy loss: Secondary | ICD-10-CM

## 2018-04-25 DIAGNOSIS — Z3A01 Less than 8 weeks gestation of pregnancy: Secondary | ICD-10-CM

## 2018-04-25 DIAGNOSIS — Z3687 Encounter for antenatal screening for uncertain dates: Secondary | ICD-10-CM

## 2018-04-25 LAB — POCT URINALYSIS DIPSTICK OB
Glucose, UA: NEGATIVE
PROTEIN: NEGATIVE

## 2018-04-25 LAB — OB RESULTS CONSOLE VARICELLA ZOSTER ANTIBODY, IGG: Varicella: NON-IMMUNE/NOT IMMUNE

## 2018-04-25 NOTE — Patient Instructions (Signed)
First Trimester of Pregnancy The first trimester of pregnancy is from week 1 until the end of week 13 (months 1 through 3). A week after a sperm fertilizes an egg, the egg will implant on the wall of the uterus. This embryo will begin to develop into a baby. Genes from you and your partner will form the baby. The female genes will determine whether the baby will be a boy or a girl. At 6-8 weeks, the eyes and face will be formed, and the heartbeat can be seen on ultrasound. At the end of 12 weeks, all the baby's organs will be formed. Now that you are pregnant, you will want to do everything you can to have a healthy baby. Two of the most important things are to get good prenatal care and to follow your health care provider's instructions. Prenatal care is all the medical care you receive before the baby's birth. This care will help prevent, find, and treat any problems during the pregnancy and childbirth. Body changes during your first trimester Your body goes through many changes during pregnancy. The changes vary from woman to woman.  You may gain or lose a couple of pounds at first.  You may feel sick to your stomach (nauseous) and you may throw up (vomit). If the vomiting is uncontrollable, call your health care provider.  You may tire easily.  You may develop headaches that can be relieved by medicines. All medicines should be approved by your health care provider.  You may urinate more often. Painful urination may mean you have a bladder infection.  You may develop heartburn as a result of your pregnancy.  You may develop constipation because certain hormones are causing the muscles that push stool through your intestines to slow down.  You may develop hemorrhoids or swollen veins (varicose veins).  Your breasts may begin to grow larger and become tender. Your nipples may stick out more, and the tissue that surrounds them (areola) may become darker.  Your gums may bleed and may be  sensitive to brushing and flossing.  Dark spots or blotches (chloasma, mask of pregnancy) may develop on your face. This will likely fade after the baby is born.  Your menstrual periods will stop.  You may have a loss of appetite.  You may develop cravings for certain kinds of food.  You may have changes in your emotions from day to day, such as being excited to be pregnant or being concerned that something may go wrong with the pregnancy and baby.  You may have more vivid and strange dreams.  You may have changes in your hair. These can include thickening of your hair, rapid growth, and changes in texture. Some women also have hair loss during or after pregnancy, or hair that feels dry or thin. Your hair will most likely return to normal after your baby is born.  What to expect at prenatal visits During a routine prenatal visit:  You will be weighed to make sure you and the baby are growing normally.  Your blood pressure will be taken.  Your abdomen will be measured to track your baby's growth.  The fetal heartbeat will be listened to between weeks 10 and 14 of your pregnancy.  Test results from any previous visits will be discussed.  Your health care provider may ask you:  How you are feeling.  If you are feeling the baby move.  If you have had any abnormal symptoms, such as leaking fluid, bleeding, severe headaches,   or abdominal cramping.  If you are using any tobacco products, including cigarettes, chewing tobacco, and electronic cigarettes.  If you have any questions.  Other tests that may be performed during your first trimester include:  Blood tests to find your blood type and to check for the presence of any previous infections. The tests will also be used to check for low iron levels (anemia) and protein on red blood cells (Rh antibodies). Depending on your risk factors, or if you previously had diabetes during pregnancy, you may have tests to check for high blood  sugar that affects pregnant women (gestational diabetes).  Urine tests to check for infections, diabetes, or protein in the urine.  An ultrasound to confirm the proper growth and development of the baby.  Fetal screens for spinal cord problems (spina bifida) and Down syndrome.  HIV (human immunodeficiency virus) testing. Routine prenatal testing includes screening for HIV, unless you choose not to have this test.  You may need other tests to make sure you and the baby are doing well.  Follow these instructions at home: Medicines  Follow your health care provider's instructions regarding medicine use. Specific medicines may be either safe or unsafe to take during pregnancy.  Take a prenatal vitamin that contains at least 600 micrograms (mcg) of folic acid.  If you develop constipation, try taking a stool softener if your health care provider approves. Eating and drinking  Eat a balanced diet that includes fresh fruits and vegetables, whole grains, good sources of protein such as meat, eggs, or tofu, and low-fat dairy. Your health care provider will help you determine the amount of weight gain that is right for you.  Avoid raw meat and uncooked cheese. These carry germs that can cause birth defects in the baby.  Eating four or five small meals rather than three large meals a day may help relieve nausea and vomiting. If you start to feel nauseous, eating a few soda crackers can be helpful. Drinking liquids between meals, instead of during meals, also seems to help ease nausea and vomiting.  Limit foods that are high in fat and processed sugars, such as fried and sweet foods.  To prevent constipation: ? Eat foods that are high in fiber, such as fresh fruits and vegetables, whole grains, and beans. ? Drink enough fluid to keep your urine clear or pale yellow. Activity  Exercise only as directed by your health care provider. Most women can continue their usual exercise routine during  pregnancy. Try to exercise for 30 minutes at least 5 days a week. Exercising will help you: ? Control your weight. ? Stay in shape. ? Be prepared for labor and delivery.  Experiencing pain or cramping in the lower abdomen or lower back is a good sign that you should stop exercising. Check with your health care provider before continuing with normal exercises.  Try to avoid standing for long periods of time. Move your legs often if you must stand in one place for a long time.  Avoid heavy lifting.  Wear low-heeled shoes and practice good posture.  You may continue to have sex unless your health care provider tells you not to. Relieving pain and discomfort  Wear a good support bra to relieve breast tenderness.  Take warm sitz baths to soothe any pain or discomfort caused by hemorrhoids. Use hemorrhoid cream if your health care provider approves.  Rest with your legs elevated if you have leg cramps or low back pain.  If you develop   varicose veins in your legs, wear support hose. Elevate your feet for 15 minutes, 3-4 times a day. Limit salt in your diet. Prenatal care  Schedule your prenatal visits by the twelfth week of pregnancy. They are usually scheduled monthly at first, then more often in the last 2 months before delivery.  Write down your questions. Take them to your prenatal visits.  Keep all your prenatal visits as told by your health care provider. This is important. Safety  Wear your seat belt at all times when driving.  Make a list of emergency phone numbers, including numbers for family, friends, the hospital, and police and fire departments. General instructions  Ask your health care provider for a referral to a local prenatal education class. Begin classes no later than the beginning of month 6 of your pregnancy.  Ask for help if you have counseling or nutritional needs during pregnancy. Your health care provider can offer advice or refer you to specialists for help  with various needs.  Do not use hot tubs, steam rooms, or saunas.  Do not douche or use tampons or scented sanitary pads.  Do not cross your legs for long periods of time.  Avoid cat litter boxes and soil used by cats. These carry germs that can cause birth defects in the baby and possibly loss of the fetus by miscarriage or stillbirth.  Avoid all smoking, herbs, alcohol, and medicines not prescribed by your health care provider. Chemicals in these products affect the formation and growth of the baby.  Do not use any products that contain nicotine or tobacco, such as cigarettes and e-cigarettes. If you need help quitting, ask your health care provider. You may receive counseling support and other resources to help you quit.  Schedule a dentist appointment. At home, brush your teeth with a soft toothbrush and be gentle when you floss. Contact a health care provider if:  You have dizziness.  You have mild pelvic cramps, pelvic pressure, or nagging pain in the abdominal area.  You have persistent nausea, vomiting, or diarrhea.  You have a bad smelling vaginal discharge.  You have pain when you urinate.  You notice increased swelling in your face, hands, legs, or ankles.  You are exposed to fifth disease or chickenpox.  You are exposed to German measles (rubella) and have never had it. Get help right away if:  You have a fever.  You are leaking fluid from your vagina.  You have spotting or bleeding from your vagina.  You have severe abdominal cramping or pain.  You have rapid weight gain or loss.  You vomit blood or material that looks like coffee grounds.  You develop a severe headache.  You have shortness of breath.  You have any kind of trauma, such as from a fall or a car accident. Summary  The first trimester of pregnancy is from week 1 until the end of week 13 (months 1 through 3).  Your body goes through many changes during pregnancy. The changes vary from  woman to woman.  You will have routine prenatal visits. During those visits, your health care provider will examine you, discuss any test results you may have, and talk with you about how you are feeling. This information is not intended to replace advice given to you by your health care provider. Make sure you discuss any questions you have with your health care provider. Document Released: 05/26/2001 Document Revised: 05/13/2016 Document Reviewed: 05/13/2016 [employer] Interactive Patient Education  2018 [employer]  

## 2018-04-25 NOTE — Progress Notes (Signed)
04/25/2018   Chief Complaint: Missed period  Transfer of Care Patient: no  History of Present Illness: Barbara Quinn is a 22 y.o. G4P0030 [redacted]w[redacted]d based on Patient's last menstrual period was 03/08/2018. with an Estimated Date of Delivery: 12/13/18, with the above CC.   Her periods were: regular periods every 28 days She was using no method when she conceived.  She has Positive signs or symptoms of nausea/vomiting of pregnancy. She has Negative signs or symptoms of miscarriage or preterm labor She identifies Negative Zika risk factors for her and her partner On any different medications around the time she conceived/early pregnancy: Yes Lexapro History of varicella: Yes   ROS: A 12-point review of systems was performed and negative, except as stated in the above HPI.  OBGYN History: As per HPI. OB History  Gravida Para Term Preterm AB Living  4 0     3 0  SAB TAB Ectopic Multiple Live Births  3            # Outcome Date GA Lbr Len/2nd Weight Sex Delivery Anes PTL Lv  4 Current           3 SAB 2019          2 SAB 2017          1 SAB 2015            Any issues with any prior pregnancies: yes- recureent preg loss (3) Any prior children are healthy, doing well, without any problems or issues: no History of pap smears: No.  Past Medical History: Past Medical History:  Diagnosis Date  . Kidney stone   . Renal disorder   . Seizure (HCC)   . Seizures (HCC)     Past Surgical History: Past Surgical History:  Procedure Laterality Date  . tubes in ears      Family History:  Family History  Problem Relation Age of Onset  . Depression Mother   . Heart attack Mother   . GER disease Mother   . GER disease Father    She denies any female cancers, bleeding or blood clotting disorders.  She denies any history of mental retardation, birth defects or genetic disorders in her or the FOB's history  Social History:  Social History   Socioeconomic History  . Marital status:  Single    Spouse name: Not on file  . Number of children: Not on file  . Years of education: Not on file  . Highest education level: Not on file  Occupational History  . Not on file  Social Needs  . Financial resource strain: Not on file  . Food insecurity:    Worry: Not on file    Inability: Not on file  . Transportation needs:    Medical: Not on file    Non-medical: Not on file  Tobacco Use  . Smoking status: Current Every Day Smoker    Packs/day: 1.00  . Smokeless tobacco: Never Used  Substance and Sexual Activity  . Alcohol use: Yes  . Drug use: No  . Sexual activity: Yes  Lifestyle  . Physical activity:    Days per week: Not on file    Minutes per session: Not on file  . Stress: Not on file  Relationships  . Social connections:    Talks on phone: Not on file    Gets together: Not on file    Attends religious service: Not on file    Active member of club or  organization: Not on file    Attends meetings of clubs or organizations: Not on file    Relationship status: Not on file  . Intimate partner violence:    Fear of current or ex partner: Not on file    Emotionally abused: Not on file    Physically abused: Not on file    Forced sexual activity: Not on file  Other Topics Concern  . Not on file  Social History Narrative  . Not on file   Any pets in the household: no  Allergy: No Known Allergies  Current Outpatient Medications:  Current Outpatient Medications:  .  metroNIDAZOLE (FLAGYL) 500 MG tablet, Take 1 tablet (500 mg total) by mouth 3 (three) times daily., Disp: 21 tablet, Rfl: 0 .  nitrofurantoin, macrocrystal-monohydrate, (MACROBID) 100 MG capsule, Take 1 capsule (100 mg total) by mouth 2 (two) times daily., Disp: 14 capsule, Rfl: 0 .  acetaminophen (TYLENOL) 500 MG tablet, Take 500 mg by mouth every 6 (six) hours as needed., Disp: , Rfl:  .  escitalopram (LEXAPRO) 20 MG tablet, Take 1 tablet (20 mg total) by mouth daily. (Patient not taking: Reported  on 04/25/2018), Disp: 90 tablet, Rfl: 3   Physical Exam:   BP 120/80   Wt 202 lb (91.6 kg)   LMP 03/08/2018   BMI 30.71 kg/m  Body mass index is 30.71 kg/m. Constitutional: Well nourished, well developed female in no acute distress.  Neck:  Supple, normal appearance, and no thyromegaly  Cardiovascular: S1, S2 normal, no murmur, rub or gallop, regular rate and rhythm Respiratory:  Clear to auscultation bilateral. Normal respiratory effort Abdomen: positive bowel sounds and no masses, hernias; diffusely non tender to palpation, non distended Breasts: breasts appear normal, no suspicious masses, no skin or nipple changes or axillary nodes. Neuro/Psych:  Normal mood and affect.  Skin:  Warm and dry.  Lymphatic:  No inguinal lymphadenopathy.   Pelvic exam: is not limited by body habitus EGBUS: within normal limits, Vagina: within normal limits and with no blood in the vault, Cervix: normal appearing cervix without discharge or lesions, closed/long/high, Uterus:  enlarged: 6 weeks, and Adnexa:  normal adnexa  Assessment: Barbara Quinn is a 22 y.o. G4P0030 [redacted]w[redacted]d based on Patient's last menstrual period was 03/08/2018. with an Estimated Date of Delivery: 12/13/18,  for prenatal care.  Plan:  1) Avoid alcoholic beverages. 2) Patient encouraged not to smoke.  3) Discontinue the use of all non-medicinal drugs and chemicals.  4) Take prenatal vitamins daily.  5) Seatbelt use advised 6) Nutrition, food safety (fish, cheese advisories, and high nitrite foods) and exercise discussed. 7) Hospital and practice style delivering at [facility] discussed  8) Patient is asked about travel to areas at risk for the Zika virus, and counseled to avoid travel and exposure to mosquitoes or sexual partners who may have themselves been exposed to the virus. Testing is discussed, and will be ordered as appropriate.  9) Childbirth classes at [facility] advised 10) Genetic Screening, such as with 1st Trimester Screening, cell  free fetal DNA, AFP testing, and Ultrasound, as well as with amniocentesis and CVS as appropriate, is discussed with patient. She plans to consider genetic testing this pregnancy. 11) Review of ULTRASOUND. I have personally reviewed images and report of recent ultrasound done at [facility].  EDC maintained ay 12/13/18 12) [place] and f/u 2 weeks due to recurrent pregnancy loss  Problem list reviewed and updated.  Annamarie Major, MD, [facility], [facility] 04/25/2018  2:34 PM

## 2018-04-25 NOTE — Addendum Note (Signed)
Addended by: Nadara Mustard on: 04/25/2018 04:51 PM   Modules accepted: Orders

## 2018-04-26 LAB — CYTOLOGY - PAP: DIAGNOSIS: NEGATIVE

## 2018-04-26 LAB — RPR+RH+ABO+RUB AB+AB SCR+CB...
ANTIBODY SCREEN: NEGATIVE
HIV SCREEN 4TH GENERATION: NONREACTIVE
Hematocrit: 36 % (ref 34.0–46.6)
Hemoglobin: 12.7 g/dL (ref 11.1–15.9)
Hepatitis B Surface Ag: NEGATIVE
MCH: 27.9 pg (ref 26.6–33.0)
MCHC: 35.3 g/dL (ref 31.5–35.7)
MCV: 79 fL (ref 79–97)
PLATELETS: 294 10*3/uL (ref 150–450)
RBC: 4.55 x10E6/uL (ref 3.77–5.28)
RDW: 13.7 % (ref 12.3–15.4)
RPR Ser Ql: NONREACTIVE
RUBELLA: 1.17 {index} (ref >0.99)
Rh Factor: POSITIVE
WBC: 7.6 10*3/uL (ref 3.4–10.8)

## 2018-04-26 LAB — DRUG SCREEN, URINE
Amphetamines, Urine: NEGATIVE ng/mL
BARBITURATE SCREEN URINE: NEGATIVE ng/mL
BENZODIAZEPINE QUANT UR: NEGATIVE ng/mL
Cannabinoid Quant, Ur: NEGATIVE ng/mL
Cocaine (Metab.): NEGATIVE ng/mL
Opiate Quant, Ur: NEGATIVE ng/mL
PCP Quant, Ur: NEGATIVE ng/mL

## 2018-04-28 ENCOUNTER — Other Ambulatory Visit: Payer: Self-pay | Admitting: Obstetrics and Gynecology

## 2018-04-28 DIAGNOSIS — Z3491 Encounter for supervision of normal pregnancy, unspecified, first trimester: Secondary | ICD-10-CM

## 2018-04-29 ENCOUNTER — Other Ambulatory Visit: Payer: Self-pay | Admitting: Obstetrics and Gynecology

## 2018-04-29 ENCOUNTER — Encounter: Payer: Self-pay | Admitting: Obstetrics & Gynecology

## 2018-04-29 MED ORDER — METOCLOPRAMIDE HCL 10 MG PO TABS: 10.0000 mg | ORAL_TABLET | Freq: Three times a day (TID) | ORAL | 2 refills | Status: DC

## 2018-04-29 MED ORDER — DOXYLAMINE-PYRIDOXINE ER 20-20 MG PO TBCR: 1.0000 | EXTENDED_RELEASE_TABLET | Freq: Two times a day (BID) | ORAL | 2 refills | Status: AC

## 2018-05-05 ENCOUNTER — Encounter: Payer: Self-pay | Admitting: Obstetrics & Gynecology

## 2018-05-08 ENCOUNTER — Encounter: Payer: Self-pay | Admitting: Obstetrics & Gynecology

## 2018-05-09 ENCOUNTER — Encounter: Payer: Self-pay | Admitting: Obstetrics and Gynecology

## 2018-05-09 ENCOUNTER — Ambulatory Visit (INDEPENDENT_AMBULATORY_CARE_PROVIDER_SITE_OTHER): Payer: BLUE CROSS/BLUE SHIELD | Admitting: Obstetrics and Gynecology

## 2018-05-09 ENCOUNTER — Ambulatory Visit (INDEPENDENT_AMBULATORY_CARE_PROVIDER_SITE_OTHER): Payer: BLUE CROSS/BLUE SHIELD

## 2018-05-09 VITALS — BP 124/76 | Wt 204.0 lb

## 2018-05-09 DIAGNOSIS — Z3A08 8 weeks gestation of pregnancy: Secondary | ICD-10-CM

## 2018-05-09 DIAGNOSIS — Z3A01 Less than 8 weeks gestation of pregnancy: Secondary | ICD-10-CM | POA: Diagnosis not present

## 2018-05-09 DIAGNOSIS — Z3491 Encounter for supervision of normal pregnancy, unspecified, first trimester: Secondary | ICD-10-CM | POA: Insufficient documentation

## 2018-05-09 DIAGNOSIS — B379 Candidiasis, unspecified: Secondary | ICD-10-CM

## 2018-05-09 DIAGNOSIS — O3680X0 Pregnancy with inconclusive fetal viability, not applicable or unspecified: Secondary | ICD-10-CM | POA: Diagnosis not present

## 2018-05-09 MED ORDER — TERCONAZOLE 0.4 % VA CREA: 1.0000 | TOPICAL_CREAM | Freq: Every day | VAGINAL | 0 refills | Status: DC

## 2018-05-09 NOTE — Progress Notes (Signed)
    Routine Prenatal Care Visit  Subjective  Barbara Quinn is a 22 y.o. G4P0030 at 7326w6d being seen today for ongoing prenatal care.  She is currently monitored for the following issues for this low-risk pregnancy and has Supervision of low-risk pregnancy, first trimester on their problem list.  ----------------------------------------------------------------------------------- Patient reports no complaints.   Contractions: Not present. Vag. Bleeding: None.   . Denies leaking of fluid.  ----------------------------------------------------------------------------------- The following portions of the patient's history were reviewed and updated as appropriate: allergies, current medications, past family history, past medical history, past social history, past surgical history and problem list. Problem list updated.   Objective  Blood pressure 124/76, weight 204 lb (92.5 kg), last menstrual period 03/08/2018. Pregravid weight Pregravid weight not on file Total Weight Gain Not found. Urinalysis:      Fetal Status: Fetal Heart Rate (bpm): 160         General:  Alert, oriented and cooperative. Patient is in no acute distress.  Skin: Skin is warm and dry. No rash noted.   Cardiovascular: Normal heart rate noted  Respiratory: Normal respiratory effort, no problems with respiration noted  Abdomen: Soft, gravid, appropriate for gestational age. Pain/Pressure: Absent     Pelvic:  Cervical exam deferred        Extremities: Normal range of motion.     Mental Status: Normal mood and affect. Normal behavior. Normal judgment and thought content.     Assessment   22 y.o. G4P0030 at 126w6d by  12/13/2018, by Last Menstrual Period presenting for routine prenatal visit  Plan   pregnancy4 Problems (from 04/07/18 to present)    Problem Noted Resolved   Supervision of low-risk pregnancy, first trimester 05/09/2018 by Natale MilchSchuman, Christanna R, MD No   Overview Signed 05/09/2018  4:19 PM by Natale MilchSchuman,  Christanna R, MD      Clinic [facility] Prenatal Labs  Dating  Blood type: A/Positive/-- (11/11 1446)   Genetic Screen  NIPS: desires    Antibody:Negative (11/11 1446)  Anatomic US  Rubella: 1.17 (11/11 1446)  Varicella: Nonimmune  GTT Early:        28 wk:      RPR: Non Reactive (11/11 1446)   Rhogam  HBsAg: Negative (11/11 1446)   TDaP vaccine                       HIV: Non Reactive (11/11 1446)   Flu Shot                                GBS:   Contraception  Pap:  CBB     CS/VBAC    Baby Food    Support Person                 Gestational age appropriate obstetric precautions including but not limited to vaginal bleeding, contractions, leaking of fluid and fetal movement were reviewed in detail with the patient.    Prescription sent to treat yeast infection noted on pap smear  Return in about 2 weeks (around 05/23/2018) for ROB.  Natale Milchhristanna R Schuman MD [facility], [facility] 05/09/2018, 4:26 PM

## 2018-05-09 NOTE — Progress Notes (Signed)
ROB No concerns 

## 2018-05-09 NOTE — Patient Instructions (Signed)
First Trimester of Pregnancy The first trimester of pregnancy is from week 1 until the end of week 13 (months 1 through 3). During this time, your baby will begin to develop inside you. At 6-8 weeks, the eyes and face are formed, and the heartbeat can be seen on ultrasound. At the end of 12 weeks, all the baby's organs are formed. Prenatal care is all the medical care you receive before the birth of your baby. Make sure you get good prenatal care and follow all of your doctor's instructions. Follow these instructions at home: Medicines  Take over-the-counter and prescription medicines only as told by your doctor. Some medicines are safe and some medicines are not safe during pregnancy.  Take a prenatal vitamin that contains at least 600 micrograms (mcg) of folic acid.  If you have trouble pooping (constipation), take medicine that will make your stool soft (stool softener) if your doctor approves. Eating and drinking  Eat regular, healthy meals.  Your doctor will tell you the amount of weight gain that is right for you.  Avoid raw meat and uncooked cheese.  If you feel sick to your stomach (nauseous) or throw up (vomit): ? Eat 4 or 5 small meals a day instead of 3 large meals. ? Try eating a few soda crackers. ? Drink liquids between meals instead of during meals.  To prevent constipation: ? Eat foods that are high in fiber, like fresh fruits and vegetables, whole grains, and beans. ? Drink enough fluids to keep your pee (urine) clear or pale yellow. Activity  Exercise only as told by your doctor. Stop exercising if you have cramps or pain in your lower belly (abdomen) or low back.  Do not exercise if it is too hot, too humid, or if you are in a place of great height (high altitude).  Try to avoid standing for long periods of time. Move your legs often if you must stand in one place for a long time.  Avoid heavy lifting.  Wear low-heeled shoes. Sit and stand up straight.  You  can have sex unless your doctor tells you not to. Relieving pain and discomfort  Wear a good support bra if your breasts are sore.  Take warm water baths (sitz baths) to soothe pain or discomfort caused by hemorrhoids. Use hemorrhoid cream if your doctor says it is okay.  Rest with your legs raised if you have leg cramps or low back pain.  If you have puffy, bulging veins (varicose veins) in your legs: ? Wear support hose or compression stockings as told by your doctor. ? Raise (elevate) your feet for 15 minutes, 3-4 times a day. ? Limit salt in your food. Prenatal care  Schedule your prenatal visits by the twelfth week of pregnancy.  Write down your questions. Take them to your prenatal visits.  Keep all your prenatal visits as told by your doctor. This is important. Safety  Wear your seat belt at all times when driving.  Make a list of emergency phone numbers. The list should include numbers for family, friends, the hospital, and police and fire departments. General instructions  Ask your doctor for a referral to a local prenatal class. Begin classes no later than at the start of month 6 of your pregnancy.  Ask for help if you need counseling or if you need help with nutrition. Your doctor can give you advice or tell you where to go for help.  Do not use hot tubs, steam rooms, or   saunas.  Do not douche or use tampons or scented sanitary pads.  Do not cross your legs for long periods of time.  Avoid all herbs and alcohol. Avoid drugs that are not approved by your doctor.  Do not use any tobacco products, including cigarettes, chewing tobacco, and electronic cigarettes. If you need help quitting, ask your doctor. You may get counseling or other support to help you quit.  Avoid cat litter boxes and soil used by cats. These carry germs that can cause birth defects in the baby and can cause a loss of your baby (miscarriage) or stillbirth.  Visit your dentist. At home, brush  your teeth with a soft toothbrush. Be gentle when you floss. Contact a doctor if:  You are dizzy.  You have mild cramps or pressure in your lower belly.  You have a nagging pain in your belly area.  You continue to feel sick to your stomach, you throw up, or you have watery poop (diarrhea).  You have a bad smelling fluid coming from your vagina.  You have pain when you pee (urinate).  You have increased puffiness (swelling) in your face, hands, legs, or ankles. Get help right away if:  You have a fever.  You are leaking fluid from your vagina.  You have spotting or bleeding from your vagina.  You have very bad belly cramping or pain.  You gain or lose weight rapidly.  You throw up blood. It may look like coffee grounds.  You are around people who have German measles, fifth disease, or chickenpox.  You have a very bad headache.  You have shortness of breath.  You have any kind of trauma, such as from a fall or a car accident. Summary  The first trimester of pregnancy is from week 1 until the end of week 13 (months 1 through 3).  To take care of yourself and your unborn baby, you will need to eat healthy meals, take medicines only if your doctor tells you to do so, and do activities that are safe for you and your baby.  Keep all follow-up visits as told by your doctor. This is important as your doctor will have to ensure that your baby is healthy and growing well. This information is not intended to replace advice given to you by your health care provider. Make sure you discuss any questions you have with your health care provider. Document Released: 11/18/2007 Document Revised: 06/09/2016 Document Reviewed: 06/09/2016 Elsevier Interactive Patient Education  2017 Elsevier Inc.  

## 2018-05-16 ENCOUNTER — Encounter: Payer: Self-pay | Admitting: Obstetrics & Gynecology

## 2018-05-23 ENCOUNTER — Encounter: Payer: Self-pay | Admitting: Advanced Practice Midwife

## 2018-05-23 ENCOUNTER — Ambulatory Visit (INDEPENDENT_AMBULATORY_CARE_PROVIDER_SITE_OTHER): Payer: BLUE CROSS/BLUE SHIELD | Admitting: Advanced Practice Midwife

## 2018-05-23 VITALS — BP 122/70 | Wt 201.0 lb

## 2018-05-23 DIAGNOSIS — Z3A10 10 weeks gestation of pregnancy: Secondary | ICD-10-CM

## 2018-05-23 DIAGNOSIS — Z1379 Encounter for other screening for genetic and chromosomal anomalies: Secondary | ICD-10-CM

## 2018-05-23 NOTE — Patient Instructions (Signed)

## 2018-05-23 NOTE — Progress Notes (Signed)
  Routine Prenatal Care Visit  Subjective  Barbara Quinn is a 22 y.o. G4P0030 at 8172w6d being seen today for ongoing prenatal care.  She is currently monitored for the following issues for this high-risk pregnancy and has Supervision of low-risk pregnancy, first trimester on their problem list.  ----------------------------------------------------------------------------------- Patient reports no complaints.   Contractions: Not present. Vag. Bleeding: None.   . Denies leaking of fluid.  ----------------------------------------------------------------------------------- The following portions of the patient's history were reviewed and updated as appropriate: allergies, current medications, past family history, past medical history, past social history, past surgical history and problem list. Problem list updated.   Objective  Blood pressure 122/70, weight 201 lb (91.2 kg), last menstrual period 03/08/2018. Pregravid weight 197 lb (89.4 kg) Total Weight Gain 4 lb (1.814 kg) Urinalysis: Urine Protein    Urine Glucose    Fetal Status: Fetal Heart Rate (bpm): 164         General:  Alert, oriented and cooperative. Patient is in no acute distress.  Skin: Skin is warm and dry. No rash noted.   Cardiovascular: Normal heart rate noted  Respiratory: Normal respiratory effort, no problems with respiration noted  Abdomen: Soft, gravid, appropriate for gestational age. Pain/Pressure: Absent     Pelvic:  Cervical exam deferred        Extremities: Normal range of motion.     Mental Status: Normal mood and affect. Normal behavior. Normal judgment and thought content.   Assessment   22 y.o. G4P0030 at 7672w6d by  12/13/2018, by Last Menstrual Period presenting for routine prenatal visit  Plan   pregnancy4 Problems (from 04/07/18 to present)    Problem Noted Resolved   Supervision of low-risk pregnancy, first trimester 05/09/2018 by Natale MilchSchuman, Christanna R, MD No   Overview Addendum 05/09/2018  4:35  PM by Natale MilchSchuman, Christanna R, MD      Clinic [facility] Prenatal Labs  Dating  LMP =8wk US Blood type: A/Positive/-- (11/11 1446)   Genetic Screen  NIPS: desires    Antibody:Negative (11/11 1446)  Anatomic US  Rubella: 1.17 (11/11 1446)  Varicella: Nonimmune  GTT 28 wk:      RPR: Non Reactive (11/11 1446)   Rhogam Not needed HBsAg: Negative (11/11 1446)   TDaP vaccine                       HIV: Non Reactive (11/11 1446)   Flu Shot   Declines                             GBS:   Contraception  Pap: NIL 2019  CBB     CS/VBAC    Baby Food    Support Person                 Preterm labor symptoms and general obstetric precautions including but not limited to vaginal bleeding, contractions, leaking of fluid and fetal movement were reviewed in detail with the patient. Please refer to After Visit Summary for other counseling recommendations.   Return in about 4 weeks (around 06/20/2018) for rob.  Tresea MallJane Ruble Pumphrey, CNM 05/23/2018 4:18 PM

## 2018-05-23 NOTE — Progress Notes (Signed)
No vb. No lof.  

## 2018-05-28 LAB — MATERNIT 21 PLUS CORE, BLOOD
CHROMOSOME 18: NEGATIVE
CHROMOSOME 21: NEGATIVE
Chromosome 13: NEGATIVE
Fetal Fraction: 8
Y Chromosome: DETECTED

## 2018-06-02 ENCOUNTER — Encounter: Payer: Self-pay | Admitting: Obstetrics & Gynecology

## 2018-06-15 NOTE — L&D Delivery Note (Signed)
Date of delivery: 12/12/2018 Estimated Date of Delivery: 12/13/18 Patient's last menstrual period was 03/08/2018. EGA: [redacted]w[redacted]d  Delivery Note At 9:09 PM a viable female was delivered via Vaginal, Spontaneous (Presentation: OA;  ROA).  APGAR: 8, 10; weight:  3680 g.   Placenta status: spontaneous,  intact. Cord: with the following complications: none.  Cord pH: NA  After 1 hour of pushing, mom was having left sided back pain and fetus was having late decelerations. Turned pitocin off and had patient push epidural PCA. She was able to breathe through contractions and labor down. Fetus recovered well. Reviewed case with Dr Glennon Mac who came to the hospital to assess. After an hour of laboring down patient was able to push well over the next hour to deliver a viable female infant.  The head followed by shoulders, which delivered without difficulty, and the rest of the body.  Tight nuchal cord noted that was reduced following delivery of the baby.  Baby to mom's chest.  Cord clamped and cut after 4 minute delay.  No cord blood obtained.  Placenta delivered spontaneously, intact, with a 3-vessel cord.  First degree vaginal laceration repaired with 3.0 Vicryl in standard fashion. Right labial laceration repaired with 4.0 Vicryl. Left labial laceration hemostatic not requiring repair. All counts correct.  Hemostasis obtained with IV pitocin and fundal massage.  Dr Glennon Mac was available throughout the delivery.   Anesthesia:  epidural Episiotomy: None Lacerations: 1st degree;Vaginal; Bilateral Labial Suture Repair: 3.0 vicryl 4.0 vicryl Est. Blood Loss (mL): 450  Mom to postpartum.  Baby to Couplet care / Skin to Skin.  Rod Can, CNM 12/12/2018, 10:17 PM

## 2018-06-20 ENCOUNTER — Encounter: Payer: Self-pay | Admitting: Obstetrics and Gynecology

## 2018-06-20 ENCOUNTER — Ambulatory Visit (INDEPENDENT_AMBULATORY_CARE_PROVIDER_SITE_OTHER): Payer: BLUE CROSS/BLUE SHIELD | Admitting: Obstetrics and Gynecology

## 2018-06-20 VITALS — BP 120/70 | Wt 200.0 lb

## 2018-06-20 DIAGNOSIS — Z3491 Encounter for supervision of normal pregnancy, unspecified, first trimester: Secondary | ICD-10-CM

## 2018-06-20 DIAGNOSIS — R300 Dysuria: Secondary | ICD-10-CM

## 2018-06-20 DIAGNOSIS — Z3A14 14 weeks gestation of pregnancy: Secondary | ICD-10-CM

## 2018-06-20 DIAGNOSIS — O9989 Other specified diseases and conditions complicating pregnancy, childbirth and the puerperium: Secondary | ICD-10-CM

## 2018-06-20 NOTE — Progress Notes (Signed)
  Routine Prenatal Care Visit  Subjective  Barbara Quinn is a 23 y.o. G4P0030 at [redacted]w[redacted]d being seen today for ongoing prenatal care.  She is currently monitored for the following issues for this low-risk pregnancy and has Supervision of low-risk pregnancy, first trimester on their problem list.  ----------------------------------------------------------------------------------- Patient reports dysuria and mild lower abdominal cramping. Contractions: Not present. Vag. Bleeding: None.   . Denies leaking of fluid.  ----------------------------------------------------------------------------------- The following portions of the patient's history were reviewed and updated as appropriate: allergies, current medications, past family history, past medical history, past social history, past surgical history and problem list. Problem list updated.   Objective  Blood pressure 120/70, weight 200 lb (90.7 kg), last menstrual period 03/08/2018. Pregravid weight 197 lb (89.4 kg) Total Weight Gain 3 lb (1.361 kg) Urinalysis: Urine Protein    Urine Glucose    Fetal Status: Fetal Heart Rate (bpm): 140         General:  Alert, oriented and cooperative. Patient is in no acute distress.  Skin: Skin is warm and dry. No rash noted.   Cardiovascular: Normal heart rate noted  Respiratory: Normal respiratory effort, no problems with respiration noted  Abdomen: Soft, gravid, appropriate for gestational age. Pain/Pressure: Present     Pelvic:  Cervical exam deferred        Extremities: Normal range of motion.     Mental Status: Normal mood and affect. Normal behavior. Normal judgment and thought content.   Assessment   22 y.o. G4P0030 at [redacted]w[redacted]d by  12/13/2018, by Last Menstrual Period presenting for routine prenatal visit  Plan   pregnancy4 Problems (from 04/07/18 to present)    Problem Noted Resolved   Supervision of low-risk pregnancy, first trimester 05/09/2018 by Natale Milch, MD No   Overview  Addendum 05/09/2018  4:35 PM by Natale Milch, MD      Clinic [facility] Prenatal Labs  Dating  LMP =8wk [place] Blood type: A/Positive/-- (11/11 1446)   Genetic Screen  NIPS: desires    Antibody:Negative (11/11 1446)  Anatomic [place]  Rubella: 1.17 (11/11 1446)  Varicella: Nonimmune  GTT 28 wk:      RPR: Non Reactive (11/11 1446)   Rhogam Not needed HBsAg: Negative (11/11 1446)   TDaP vaccine                       HIV: Non Reactive (11/11 1446)   Flu Shot   Declines                             GBS:   Contraception  Pap: NIL 2019  CBB     CS/VBAC    Baby Food    Support Person              Preterm labor symptoms and general obstetric precautions including but not limited to vaginal bleeding, contractions, leaking of fluid and fetal movement were reviewed in detail with the patient. Please refer to After Visit Summary for other counseling recommendations.   - urine culture of abdominal cramping.  Return in about 4 weeks (around 07/18/2018) for U/S for anatomy and routine prenatal.  Thomasene Mohair, MD, [facility], [facility] 06/20/2018 4:36 PM

## 2018-06-23 LAB — URINE CULTURE

## 2018-07-18 ENCOUNTER — Ambulatory Visit (INDEPENDENT_AMBULATORY_CARE_PROVIDER_SITE_OTHER): Payer: BLUE CROSS/BLUE SHIELD | Admitting: Obstetrics and Gynecology

## 2018-07-18 ENCOUNTER — Ambulatory Visit (INDEPENDENT_AMBULATORY_CARE_PROVIDER_SITE_OTHER): Payer: BLUE CROSS/BLUE SHIELD

## 2018-07-18 ENCOUNTER — Encounter: Payer: Self-pay | Admitting: Obstetrics and Gynecology

## 2018-07-18 VITALS — BP 120/60 | Wt 210.0 lb

## 2018-07-18 DIAGNOSIS — Z3A18 18 weeks gestation of pregnancy: Secondary | ICD-10-CM

## 2018-07-18 DIAGNOSIS — Z363 Encounter for antenatal screening for malformations: Secondary | ICD-10-CM | POA: Diagnosis not present

## 2018-07-18 DIAGNOSIS — Z3491 Encounter for supervision of normal pregnancy, unspecified, first trimester: Secondary | ICD-10-CM

## 2018-07-18 LAB — POCT URINALYSIS DIPSTICK OB
Glucose, UA: NEGATIVE
POC,PROTEIN,UA: NEGATIVE

## 2018-07-18 NOTE — Progress Notes (Signed)
Routine Prenatal Care Visit  Subjective  Barbara Quinn is a 23 y.o. G4P0030 at 3245w6d being seen today for ongoing prenatal care.  She is currently monitored for the following issues for this low-risk pregnancy and has Supervision of low-risk pregnancy, first trimester on their problem list.  ----------------------------------------------------------------------------------- Patient reports no complaints.   Contractions: Not present. Vag. Bleeding: None.  Movement: Absent. Denies leaking of fluid.  U/S complete today. ----------------------------------------------------------------------------------- The following portions of the patient's history were reviewed and updated as appropriate: allergies, current medications, past family history, past medical history, past social history, past surgical history and problem list. Problem list updated.  Objective  Blood pressure 120/60, weight 210 lb (95.3 kg), last menstrual period 03/08/2018. Pregravid weight 197 lb (89.4 kg) Total Weight Gain 13 lb (5.897 kg) Urinalysis: Urine Protein Negative  Urine Glucose Negative  Fetal Status: Fetal Heart Rate (bpm): Present   Movement: Absent     General:  Alert, oriented and cooperative. Patient is in no acute distress.  Skin: Skin is warm and dry. No rash noted.   Cardiovascular: Normal heart rate noted  Respiratory: Normal respiratory effort, no problems with respiration noted  Abdomen: Soft, gravid, appropriate for gestational age. Pain/Pressure: Absent     Pelvic:  Cervical exam deferred        Extremities: Normal range of motion.     Mental Status: Normal mood and affect. Normal behavior. Normal judgment and thought content.   Imaging Results Koreas Ob Comp + 14 Wk  Result Date: 07/18/2018 ULTRASOUND REPORT Location: [facility] Date of Service: 07/18/2018 Patient Name: Barbara Quinn DOB: 12-18-1995 MRN: [id number] Indications:Anatomy Ultrasound Findings: Mason JimSingleton intrauterine pregnancy is  visualized with FHR at 137 BPM. Biometrics give an (U/S) Gestational age of 362w2d and an (U/S) EDD of 12/10/2018; this correlates with the clinically established Estimated Date of Delivery: 12/13/18 Fetal presentation is Cephalic. EFW: 288 grams (10 oz). Placenta: posterior. Grade: 0 AFI: subjectively normal. Anatomic survey is complete and normal; Gender - female.  Right Ovary is normal in appearance. Left Ovary is normal appearance. Survey of the adnexa demonstrates no adnexal masses. There is no free peritoneal fluid in the cul de sac. Impression: 1. 4545w6d Viable Singleton Intrauterine pregnancy by U/S. 2. (U/S) EDD is consistent with Clinically established Estimated Date of Delivery: 12/13/18 . 3. Normal Anatomy Scan Mital bahen P Patel, RDMS There is a singleton gestation with subjectively normal amniotic fluid volume. The fetal biometry correlates with established dating. Detailed evaluation of the fetal anatomy was performed.The fetal anatomical survey appears within normal limits within the resolution of ultrasound as described above.  It must be noted that a normal ultrasound is unable to rule out fetal aneuploidy nor is it able to detect all possible malformations.   The ultrasound images and findings were reviewed by me and I agree with the above report. Thomasene MohairStephen Abad Manard, MD, FACOG [facility], [facility] 07/18/2018 5:01 PM       Assessment   22 y.o. G4P0030 at 2445w6d by  12/13/2018, by Last Menstrual Period presenting for routine prenatal visit  Plan   pregnancy4 Problems (from 04/07/18 to present)    Problem Noted Resolved   Supervision of low-risk pregnancy, first trimester 05/09/2018 by Natale MilchSchuman, Christanna R, MD No   Overview Addendum 05/09/2018  4:35 PM by Natale MilchSchuman, Christanna R, MD      Clinic [facility] Prenatal Labs  Dating  LMP =8wk US Blood type: A/Positive/-- (11/11 1446)   Genetic Screen  NIPS:  desires    Antibody:Negative (11/11 1446)  Anatomic [place]  Rubella: 1.17  (11/11 1446)  Varicella: Nonimmune  GTT 28 wk:      RPR: Non Reactive (11/11 1446)   Rhogam Not needed HBsAg: Negative (11/11 1446)   TDaP vaccine                       HIV: Non Reactive (11/11 1446)   Flu Shot   Declines                             GBS:   Contraception  Pap: NIL 2019  CBB     CS/VBAC    Baby Food    Support Person              Preterm labor symptoms and general obstetric precautions including but not limited to vaginal bleeding, contractions, leaking of fluid and fetal movement were reviewed in detail with the patient. Please refer to After Visit Summary for other counseling recommendations.   Return in about 4 weeks (around 08/15/2018) for Routine Prenatal Appointment.  Thomasene Mohair, MD, [facility], [facility] 07/18/2018 6:13 PM

## 2018-08-15 ENCOUNTER — Ambulatory Visit (INDEPENDENT_AMBULATORY_CARE_PROVIDER_SITE_OTHER): Payer: BLUE CROSS/BLUE SHIELD | Admitting: Obstetrics and Gynecology

## 2018-08-15 ENCOUNTER — Encounter: Payer: Self-pay | Admitting: Obstetrics and Gynecology

## 2018-08-15 VITALS — BP 120/72 | Wt 218.0 lb

## 2018-08-15 DIAGNOSIS — Z3A22 22 weeks gestation of pregnancy: Secondary | ICD-10-CM

## 2018-08-15 DIAGNOSIS — Z3492 Encounter for supervision of normal pregnancy, unspecified, second trimester: Secondary | ICD-10-CM

## 2018-08-15 DIAGNOSIS — Z3491 Encounter for supervision of normal pregnancy, unspecified, first trimester: Secondary | ICD-10-CM

## 2018-08-15 LAB — POCT URINALYSIS DIPSTICK OB
GLUCOSE, UA: NEGATIVE
POC,PROTEIN,UA: NEGATIVE

## 2018-08-15 LAB — GLUCOSE, POCT (MANUAL RESULT ENTRY): POC Glucose: 106 mg/dl — AB (ref 70–99)

## 2018-08-15 NOTE — Progress Notes (Signed)
Routine Prenatal Care Visit  Subjective  Barbara Quinn is a 23 y.o. G4P0030 at [redacted]w[redacted]d being seen today for ongoing prenatal care.  She is currently monitored for the following issues for this low-risk pregnancy and has Supervision of low-risk pregnancy, first trimester on their problem list.  ----------------------------------------------------------------------------------- Patient reports no complaints.   Contractions: Not present. Vag. Bleeding: None.  Movement: Present. Denies leaking of fluid.  ----------------------------------------------------------------------------------- The following portions of the patient's history were reviewed and updated as appropriate: allergies, current medications, past family history, past medical history, past social history, past surgical history and problem list. Problem list updated.   Objective  Blood pressure 120/72, weight 218 lb (98.9 kg), last menstrual period 03/08/2018. Pregravid weight 197 lb (89.4 kg) Total Weight Gain 21 lb (9.526 kg) Urinalysis:      Fetal Status: Fetal Heart Rate (bpm): 130   Movement: Present     General:  Alert, oriented and cooperative. Patient is in no acute distress.  Skin: Skin is warm and dry. No rash noted.   Cardiovascular: Normal heart rate noted  Respiratory: Normal respiratory effort, no problems with respiration noted  Abdomen: Soft, gravid, appropriate for gestational age. Pain/Pressure: Absent     Pelvic:  Cervical exam deferred        Extremities: Normal range of motion.     Mental Status: Normal mood and affect. Normal behavior. Normal judgment and thought content.     Assessment   23 y.o. G4P0030 at [redacted]w[redacted]d by  12/13/2018, by Last Menstrual Period presenting for routine prenatal visit  Plan   pregnancy4 Problems (from 04/07/18 to present)    Problem Noted Resolved   Supervision of low-risk pregnancy, first trimester 05/09/2018 by Natale Milch, MD No   Overview Addendum  08/15/2018  4:50 PM by Natale Milch, MD      Clinic [facility] Prenatal Labs  Dating  LMP =8wk [place] Blood type: A/Positive/-- (11/11 1446)   Genetic Screen  NIPS: desires    Antibody:Negative (11/11 1446)  Anatomic [place] complete Rubella: 1.17 (11/11 1446)  Varicella: Nonimmune  GTT 28 wk:      RPR: Non Reactive (11/11 1446)   Rhogam Not needed HBsAg: Negative (11/11 1446)   TDaP vaccine                       HIV: Non Reactive (11/11 1446)   Flu Shot   Declines                             GBS:   Contraception  Pap: NIL 2019  CBB     CS/VBAC    Baby Food Breast/bottle   Support Person                 Gestational age appropriate obstetric precautions including but not limited to vaginal bleeding, contractions, leaking of fluid and fetal movement were reviewed in detail with the patient.    Finger stick glucose 106: Patient concerned that she may have diabetes because of family history, finger stick 1-2 hours after food today.  Given information on prenatal classes with [facility].  Given information on volunteer doula program at the hospital.  Discussed breast feeding. Patient is planning on breast and bottle feeding. Patient was given resources and lactation consultation was advised. Given ready set baby pamphlet and breast feeding book.  Return in about 3 weeks (around 09/05/2018) for ROB .  Ryer Asato  Rudolpho Sevin MD [facility], [facility] 08/15/2018, 4:50 PM

## 2018-08-15 NOTE — Progress Notes (Signed)
ROB- no concerns 

## 2018-09-05 ENCOUNTER — Ambulatory Visit (INDEPENDENT_AMBULATORY_CARE_PROVIDER_SITE_OTHER): Payer: BLUE CROSS/BLUE SHIELD | Admitting: Obstetrics and Gynecology

## 2018-09-05 ENCOUNTER — Other Ambulatory Visit: Payer: Self-pay

## 2018-09-05 VITALS — BP 126/58 | Wt 224.0 lb

## 2018-09-05 DIAGNOSIS — Z3A25 25 weeks gestation of pregnancy: Secondary | ICD-10-CM

## 2018-09-05 DIAGNOSIS — Z3491 Encounter for supervision of normal pregnancy, unspecified, first trimester: Secondary | ICD-10-CM

## 2018-09-05 DIAGNOSIS — Z3492 Encounter for supervision of normal pregnancy, unspecified, second trimester: Secondary | ICD-10-CM

## 2018-09-05 LAB — POCT URINALYSIS DIPSTICK OB
Glucose, UA: NEGATIVE
PROTEIN: NEGATIVE

## 2018-09-05 NOTE — Progress Notes (Signed)
ROB Heartburn  

## 2018-09-05 NOTE — Progress Notes (Signed)
    Routine Prenatal Care Visit  Subjective  Barbara Quinn is a 23 y.o. G4P0030 at [redacted]w[redacted]d being seen today for ongoing prenatal care.  She is currently monitored for the following issues for this low-risk pregnancy and has Supervision of low-risk pregnancy, first trimester on their problem list.  ----------------------------------------------------------------------------------- Patient reports no complaints.   Contractions: Not present. Vag. Bleeding: None.  Movement: Present. Denies leaking of fluid.  ----------------------------------------------------------------------------------- The following portions of the patient's history were reviewed and updated as appropriate: allergies, current medications, past family history, past medical history, past social history, past surgical history and problem list. Problem list updated.   Objective  Blood pressure (!) 126/58, weight 224 lb (101.6 kg), last menstrual period 03/08/2018. Pregravid weight 197 lb (89.4 kg) Total Weight Gain 27 lb (12.2 kg) Urinalysis:      Fetal Status: Fetal Heart Rate (bpm): 140   Movement: Present     General:  Alert, oriented and cooperative. Patient is in no acute distress.  Skin: Skin is warm and dry. No rash noted.   Cardiovascular: Normal heart rate noted  Respiratory: Normal respiratory effort, no problems with respiration noted  Abdomen: Soft, gravid, appropriate for gestational age. Pain/Pressure: Absent     Pelvic:  Cervical exam deferred        Extremities: Normal range of motion.     ental Status: Normal mood and affect. Normal behavior. Normal judgment and thought content.     Assessment   23 y.o. G4P0030 at [redacted]w[redacted]d by  12/13/2018, by Last Menstrual Period presenting for routine prenatal visit  Plan   pregnancy4 Problems (from 04/07/18 to present)    Problem Noted Resolved   Supervision of low-risk pregnancy, first trimester 05/09/2018 by Natale Milch, MD No   Overview Addendum  08/15/2018  4:50 PM by Natale Milch, MD      Clinic [facility] Prenatal Labs  Dating  LMP =8wk [place] Blood type: A/Positive/-- (11/11 1446)   Genetic Screen  NIPS: desires    Antibody:Negative (11/11 1446)  Anatomic [place] complete Rubella: 1.17 (11/11 1446)  Varicella: Nonimmune  GTT 28 wk:      RPR: Non Reactive (11/11 1446)   Rhogam Not needed HBsAg: Negative (11/11 1446)   TDaP vaccine                       HIV: Non Reactive (11/11 1446)   Flu Shot   Declines                             GBS:   Contraception  Pap: NIL 2019  CBB     CS/VBAC    Baby Food Breast/bottle   Support Person                 Gestational age appropriate obstetric precautions including but not limited to vaginal bleeding, contractions, leaking of fluid and fetal movement were reviewed in detail with the patient.    Return in about 3 weeks (around 09/26/2018) for ROB and 28 week labs.  Vena Austria, MD, [facility], [facility] 09/05/2018, 4:22 PM

## 2018-09-26 ENCOUNTER — Ambulatory Visit (INDEPENDENT_AMBULATORY_CARE_PROVIDER_SITE_OTHER): Payer: BLUE CROSS/BLUE SHIELD | Admitting: Obstetrics and Gynecology

## 2018-09-26 ENCOUNTER — Other Ambulatory Visit: Payer: BLUE CROSS/BLUE SHIELD

## 2018-09-26 ENCOUNTER — Other Ambulatory Visit: Payer: Self-pay

## 2018-09-26 ENCOUNTER — Encounter: Payer: Self-pay | Admitting: Obstetrics and Gynecology

## 2018-09-26 VITALS — BP 130/80 | Wt 228.0 lb

## 2018-09-26 DIAGNOSIS — Z3A28 28 weeks gestation of pregnancy: Secondary | ICD-10-CM

## 2018-09-26 DIAGNOSIS — Z3491 Encounter for supervision of normal pregnancy, unspecified, first trimester: Secondary | ICD-10-CM

## 2018-09-26 DIAGNOSIS — O26843 Uterine size-date discrepancy, third trimester: Secondary | ICD-10-CM

## 2018-09-26 DIAGNOSIS — Z3A25 25 weeks gestation of pregnancy: Secondary | ICD-10-CM

## 2018-09-26 NOTE — Progress Notes (Signed)
    Routine Prenatal Care Visit  Subjective  Barbara Quinn is a 23 y.o. G4P0030 at [redacted]w[redacted]d being seen today for ongoing prenatal care.  She is currently monitored for the following issues for this low-risk pregnancy and has Supervision of low-risk pregnancy, first trimester on their problem list.  ----------------------------------------------------------------------------------- Patient reports no complaints.   Contractions: Not present. Vag. Bleeding: None.  Movement: Present. Denies leaking of fluid.  ----------------------------------------------------------------------------------- The following portions of the patient's history were reviewed and updated as appropriate: allergies, current medications, past family history, past medical history, past social history, past surgical history and problem list. Problem list updated.   Objective  Blood pressure 130/80, weight 228 lb (103.4 kg), last menstrual period 03/08/2018. Pregravid weight 197 lb (89.4 kg) Total Weight Gain 31 lb (14.1 kg) Urinalysis:      Fetal Status: Fetal Heart Rate (bpm): 120 Fundal Height: 34 cm Movement: Present     General:  Alert, oriented and cooperative. Patient is in no acute distress.  Skin: Skin is warm and dry. No rash noted.   Cardiovascular: Normal heart rate noted  Respiratory: Normal respiratory effort, no problems with respiration noted  Abdomen: Soft, gravid, appropriate for gestational age. Pain/Pressure: Absent     Pelvic:  Cervical exam deferred        Extremities: Normal range of motion.  Edema: None  Mental Status: Normal mood and affect. Normal behavior. Normal judgment and thought content.     Assessment   23 y.o. G4P0030 at [redacted]w[redacted]d by  12/13/2018, by Last Menstrual Period presenting for routine prenatal visit  Plan   pregnancy4 Problems (from 04/07/18 to present)    Problem Noted Resolved   Supervision of low-risk pregnancy, first trimester 05/09/2018 by Natale Milch, MD No    Overview Addendum 09/26/2018  3:11 PM by Natale Milch, MD      Clinic [facility] Prenatal Labs  Dating  LMP =8wk [place] Blood type: A/Positive/-- (11/11 1446)   Genetic Screen  NIPS: desires    Antibody:Negative (11/11 1446)  Anatomic [place] complete Rubella: 1.17 (11/11 1446)  Varicella: Nonimmune  GTT 28 wk:      RPR: Non Reactive (11/11 1446)   Rhogam Not needed HBsAg: Negative (11/11 1446)   TDaP vaccine    [ ]  would like at 32 wks       HIV: Non Reactive (11/11 1446)   Flu Shot   Declines                             GBS:   Contraception  Pap: NIL 2019  CBB     CS/VBAC    Baby Food Breast/bottle   Support Person                 Gestational age appropriate obstetric precautions including but not limited to vaginal bleeding, contractions, leaking of fluid and fetal movement were reviewed in detail with the patient.    Growth [place] next visit for size > dates. 1GTT today Discussed COVID-19 PNC modifications  Return in about 4 weeks (around 10/24/2018) for in person visit ROB ans [place].  Natale Milch MD [facility], [facility] 09/26/2018, 3:11 PM

## 2018-09-26 NOTE — Patient Instructions (Signed)
 Hello,  Given the current COVID-19 pandemic, our practice is making changes in how we are providing care to our patients. We are limiting in-person visits for the safety of all of our patients.   As a practice, we have met to discuss the best way to minimize visits, but still provide excellent care to our expecting mothers.  We have decided on the following visit structure for low-risk pregnancies.  Initial Pregnancy visit will be conducted as a telephone or web visit.  Between 10-14 weeks  there will be one in-person visit for an ultrasound, lab work, and genetic screening. 20 weeks in-person visit with an anatomy ultrasound  28 weeks in-person office visit for a 1-hour glucose test and a TDAP vaccination 32 weeks in-person office visit 34 weeks telephone visit 36 weeks in-person office visit for GBS, chlamydia, and gonorrhea testing 38 weeks in-person office visit 40 weeks in-person office visit  Understandably, some patients will require more visits than what is outlined above. Additional visits will be determined on a case-by-case basis.   We will, as always, be available for emergencies or to address concerns that might arise between in-person visits. We ask that you allow us the opportunity to address any concerns over the phone or through a virtual visit first. We will be available to return your phone calls throughout the day.   If you are able to purchase a scale, a blood pressure machine, and a home fetal doppler visits could be limited further. This will help decrease your exposure risks, but these purchases are not a necessity.   Things seem to change daily and there is the possibility that this structure could change, please be patient as we adapt to a new way of caring for patients.   Thank you for trusting us with your prenatal care. Our practice values you and looks forward to providing you with excellent care.   Sincerely,   [facility], [facility]      COVID-19 and Your Pregnancy FAQ  How can I prevent infection with COVID-19 during my pregnancy? Social distancing is key. Please limit any interactions in public. Try and work from home if possible. Frequently wash your hands after touching possibly contaminated surfaces. Avoid touching your face.  Minimize trips to the store. Consider online ordering when possible.   Should I wear a mask? YES. It is recommended by the CDC that all people wear a cloth mask or facial covering in public. This will help reduce transmission as well as your risk or acquiring COVID-19. New studies are showing that even asymptomatic individuals can spread the virus from talking.   What are the symptoms of COVID-19? Fever (greater than 100.4 F), dry cough, shortness of breath.  Am I more at risk for COVID-19 since I am pregnant? There is not currently data showing that pregnant women are more adversely impacted by COVID-19 than the general population. However, we know that pregnant women tend to have worse respiratory complications from similar diseases such as the flu and SARS and for this reason should be considered an at-risk population.  What do I do if I am experiencing the symptoms of COVID-19? Testing is being limited because of test availability. If you are experiencing symptoms you should quarantine yourself, and the members of your family, for at least 2 weeks at home.   Please visit this website for more information: https://www.cdc.gov/coronavirus/2019-ncov/if-you-are-sick/steps-when-sick.html  When should I go to the Emergency Room? Please go to the emergency room if   you are experiencing ANY of these symptoms*:  1.    Difficulty breathing or shortness of breath 2.    Persistent pain or pressure in the chest 3.    Confusion or difficulty being aroused (or awakened) 4.    Bluish lips or face  *This list is not all inclusive. Please consult our office for any other symptoms that are severe  or concerning.  What do I do if I am having difficulty breathing? You should go to the Emergency Room for evaluation. At this time they have a tent set up for evaluating patients with COVID-19 symptoms.   How will my prenatal care be different because of the COVID-19 pandemic? It has been recommended to reduce the frequency of face-to-face visits and use resources such as telephone and virtual visits when possible. Using a scale, blood pressure machine and fetal doppler at home can further help reduce face-to-face visits. You will be provided with additional information on this topic.  We ask that you come to your visits alone to minimize potential exposures to  COVID-19.  How can I receive childbirth education? At this time in-person classes have been cancelled. You can register for online childbirth education, breastfeeding, and newborn care classes.  Please visit:  [email or web address] for more information  How will my hospital birth experience be different? The hospital is currently limiting visitors. This means that while you are in labor you can only have one person at the hospital with you. Additional family members will not be allowed to wait in the building or outside your room. Your one support person can be the father of the baby, a relative, a doula, or a friend. Once one support person is designated that person will wear a band. This band cannot be shared with multiple people.  Nitrous Gas is not being offered for pain relief since the tubing and filter for the machine can not be sanitized in a way to guarantee prevention of transmission of COVID-19.  Nasal cannula use of oxygen for fetal indications has also been discontinued.  Currently a clear plastic sheet is being hung between mom and the delivering provider during pushing and delivery to help prevent transmission of COVID-19.      How long will I stay in the hospital for after giving birth? It is also recommended that  discharge home be expedited during the COVID-19 outbreak. This means staying for 1 day after a vaginal delivery and 2 days after a cesarean section. Patients who need to stay longer for medical reasons are allowed to do so, but the goal will be for expedited discharge home.   What if I have COVID-19 and I am in labor? We ask that you wear a mask while on labor and delivery. We will try and accommodate you being placed in a room that is capable of filtering the air. Please call ahead if you are in labor and on your way to the hospital. The phone number for labor and delivery at [facility] is [phone].  If I have COVID-19 when my baby is born how can I prevent my baby from contracting COVID-19? This is an issue that will have to be discussed on a case-by-case basis. Current recommendations suggest providing separate isolation rooms for both the mother and new infant as well as limiting visitors. However, there are practical challenges to this recommendation. The situation will assuredly change and decisions will be influenced by the desires of the mother and availability of space.    Some suggestions are the use of a curtain or physical barrier between mom and infant, hand hygiene, mom wearing a mask, or 6 feet of spacing between a mom and infant.   Can I breastfeed during the COVID-19 pandemic?   Yes, breastfeeding is encouraged.  Can I breastfeed if I have COVID-19? Yes. Covid-19 has not been found in breast milk. This means you cannot give COVID-19 to your child through breast milk. Breast feeding will also help pass antibodies to fight infection to your baby.   What precautions should I take when breastfeeding if I have COVID-19? If a mother and newborn do room-in and the mother wishes to feed at the breast, she should put on a facemask and practice hand hygiene before each feeding.  What precautions should I take when pumping if I have COVID-19? Prior to expressing  breast milk, mothers should practice hand hygiene. After each pumping session, all parts that come into contact with breast milk should be thoroughly washed and the entire pump should be appropriately disinfected per the manufacturer's instructions. This expressed breast milk should be fed to the newborn by a healthy caregiver.  What if I am pregnant and work in healthcare? Based on limited data regarding COVID-19 and pregnancy, ACOG currently does not propose creating additional restrictions on pregnant health care personnel because of COVID-19 alone. Pregnant women do not appear to be at higher risk of severe disease related to COVID-19. Pregnant health care personnel should follow CDC risk assessment and infection control guidelines for health care personnel exposed to patients with suspected or confirmed COVID-19. Adherence to recommended infection prevention and control practices is an important part of protecting all health care personnel in health care settings.    Information on COVID-19 in pregnancy is very limited; however, facilities may want to consider limiting exposure of pregnant health care personnel to patients with confirmed or suspected COVID-19 infection, especially during higher-risk procedures (eg, aerosol-generating procedures), if feasible, based on staffing availability.

## 2018-09-26 NOTE — Progress Notes (Signed)
ROB/28 Week labs No concerns  BT consent today would like to wait for Tdap shot

## 2018-09-27 LAB — 28 WEEK RH+PANEL
Basophils Absolute: 0 10*3/uL (ref 0.0–0.2)
Basos: 0 %
EOS (ABSOLUTE): 0.1 10*3/uL (ref 0.0–0.4)
Eos: 1 %
Gestational Diabetes Screen: 93 mg/dL (ref 65–139)
HIV Screen 4th Generation wRfx: NONREACTIVE
Hematocrit: 33.3 % — ABNORMAL LOW (ref 34.0–46.6)
Hemoglobin: 11.7 g/dL (ref 11.1–15.9)
Immature Grans (Abs): 0.1 10*3/uL (ref 0.0–0.1)
Immature Granulocytes: 1 %
Lymphocytes Absolute: 1.9 10*3/uL (ref 0.7–3.1)
Lymphs: 18 %
MCH: 29.5 pg (ref 26.6–33.0)
MCHC: 35.1 g/dL (ref 31.5–35.7)
MCV: 84 fL (ref 79–97)
Monocytes Absolute: 0.9 10*3/uL (ref 0.1–0.9)
Monocytes: 9 %
Neutrophils Absolute: 7.4 10*3/uL — ABNORMAL HIGH (ref 1.4–7.0)
Neutrophils: 71 %
Platelets: 299 10*3/uL (ref 150–450)
RBC: 3.97 x10E6/uL (ref 3.77–5.28)
RDW: 13.8 % (ref 11.7–15.4)
RPR Ser Ql: NONREACTIVE
WBC: 10.3 10*3/uL (ref 3.4–10.8)

## 2018-10-24 ENCOUNTER — Ambulatory Visit (INDEPENDENT_AMBULATORY_CARE_PROVIDER_SITE_OTHER): Payer: BLUE CROSS/BLUE SHIELD | Admitting: Obstetrics & Gynecology

## 2018-10-24 ENCOUNTER — Ambulatory Visit (INDEPENDENT_AMBULATORY_CARE_PROVIDER_SITE_OTHER): Payer: BLUE CROSS/BLUE SHIELD

## 2018-10-24 ENCOUNTER — Other Ambulatory Visit: Payer: Self-pay

## 2018-10-24 VITALS — BP 140/84 | Wt 231.0 lb

## 2018-10-24 DIAGNOSIS — Z3A32 32 weeks gestation of pregnancy: Secondary | ICD-10-CM

## 2018-10-24 DIAGNOSIS — Z23 Encounter for immunization: Secondary | ICD-10-CM | POA: Diagnosis not present

## 2018-10-24 DIAGNOSIS — O26843 Uterine size-date discrepancy, third trimester: Secondary | ICD-10-CM | POA: Diagnosis not present

## 2018-10-24 DIAGNOSIS — O9989 Other specified diseases and conditions complicating pregnancy, childbirth and the puerperium: Secondary | ICD-10-CM

## 2018-10-24 DIAGNOSIS — N3001 Acute cystitis with hematuria: Secondary | ICD-10-CM

## 2018-10-24 LAB — POCT URINALYSIS DIPSTICK
Bilirubin, UA: NEGATIVE
Glucose, UA: NEGATIVE
Ketones, UA: NEGATIVE
Nitrite, UA: POSITIVE
Protein, UA: POSITIVE — AB
Spec Grav, UA: 1.02 (ref 1.010–1.025)
Urobilinogen, UA: 0.2 E.U./dL
pH, UA: 6 (ref 5.0–8.0)

## 2018-10-24 MED ORDER — CLOTRIMAZOLE-BETAMETHASONE 1-0.05 % EX CREA: 1.0000 "application " | TOPICAL_CREAM | Freq: Two times a day (BID) | CUTANEOUS | 0 refills | Status: DC

## 2018-10-24 MED ORDER — NITROFURANTOIN MONOHYD MACRO 100 MG PO CAPS: 100.0000 mg | ORAL_CAPSULE | Freq: Two times a day (BID) | ORAL | 0 refills | Status: DC

## 2018-10-24 NOTE — Patient Instructions (Signed)
Third Trimester of Pregnancy The third trimester is from week 28 through week 40 (months 7 through 9). The third trimester is a time when the unborn baby (fetus) is growing rapidly. At the end of the ninth month, the fetus is about 20 inches in length and weighs 6-10 pounds. Body changes during your third trimester Your body will continue to go through many changes during pregnancy. The changes vary from woman to woman. During the third trimester:  Your weight will continue to increase. You can expect to gain 25-35 pounds (11-16 kg) by the end of the pregnancy.  You may begin to get stretch marks on your hips, abdomen, and breasts.  You may urinate more often because the fetus is moving lower into your pelvis and pressing on your bladder.  You may develop or continue to have heartburn. This is caused by increased hormones that slow down muscles in the digestive tract.  You may develop or continue to have constipation because increased hormones slow digestion and cause the muscles that push waste through your intestines to relax.  You may develop hemorrhoids. These are swollen veins (varicose veins) in the rectum that can itch or be painful.  You may develop swollen, bulging veins (varicose veins) in your legs.  You may have increased body aches in the pelvis, back, or thighs. This is due to weight gain and increased hormones that are relaxing your joints.  You may have changes in your hair. These can include thickening of your hair, rapid growth, and changes in texture. Some women also have hair loss during or after pregnancy, or hair that feels dry or thin. Your hair will most likely return to normal after your baby is born.  Your breasts will continue to grow and they will continue to become tender. A yellow fluid (colostrum) may leak from your breasts. This is the first milk you are producing for your baby.  Your belly button may stick out.  You may notice more swelling in your hands,  face, or ankles.  You may have increased tingling or numbness in your hands, arms, and legs. The skin on your belly may also feel numb.  You may feel short of breath because of your expanding uterus.  You may have more problems sleeping. This can be caused by the size of your belly, increased need to urinate, and an increase in your body's metabolism.  You may notice the fetus "dropping," or moving lower in your abdomen (lightening).  You may have increased vaginal discharge.  You may notice your joints feel loose and you may have pain around your pelvic bone. What to expect at prenatal visits You will have prenatal exams every 2 weeks until week 36. Then you will have weekly prenatal exams. During a routine prenatal visit:  You will be weighed to make sure you and the baby are growing normally.  Your blood pressure will be taken.  Your abdomen will be measured to track your baby's growth.  The fetal heartbeat will be listened to.  Any test results from the previous visit will be discussed.  You may have a cervical check near your due date to see if your cervix has softened or thinned (effaced).  You will be tested for Group B streptococcus. This happens between 35 and 37 weeks. Your health care provider may ask you:  What your birth plan is.  How you are feeling.  If you are feeling the baby move.  If you have had any abnormal   symptoms, such as leaking fluid, bleeding, severe headaches, or abdominal cramping.  If you are using any tobacco products, including cigarettes, chewing tobacco, and electronic cigarettes.  If you have any questions. Other tests or screenings that may be performed during your third trimester include:  Blood tests that check for low iron levels (anemia).  Fetal testing to check the health, activity level, and growth of the fetus. Testing is done if you have certain medical conditions or if there are problems during the pregnancy.  Nonstress test  (NST). This test checks the health of your baby to make sure there are no signs of problems, such as the baby not getting enough oxygen. During this test, a belt is placed around your belly. The baby is made to move, and its heart rate is monitored during movement. What is false labor? False labor is a condition in which you feel small, irregular tightenings of the muscles in the womb (contractions) that usually go away with rest, changing position, or drinking water. These are called Braxton Hicks contractions. Contractions may last for hours, days, or even weeks before true labor sets in. If contractions come at regular intervals, become more frequent, increase in intensity, or become painful, you should see your health care provider. What are the signs of labor?  Abdominal cramps.  Regular contractions that start at 10 minutes apart and become stronger and more frequent with time.  Contractions that start on the top of the uterus and spread down to the lower abdomen and back.  Increased pelvic pressure and dull back pain.  A watery or bloody mucus discharge that comes from the vagina.  Leaking of amniotic fluid. This is also known as your "water breaking." It could be a slow trickle or a gush. Let your health care provider know if it has a color or strange odor. If you have any of these signs, call your health care provider right away, even if it is before your due date. Follow these instructions at home: Medicines  Follow your health care provider's instructions regarding medicine use. Specific medicines may be either safe or unsafe to take during pregnancy.  Take a prenatal vitamin that contains at least 600 micrograms (mcg) of folic acid.  If you develop constipation, try taking a stool softener if your health care provider approves. Eating and drinking   Eat a balanced diet that includes fresh fruits and vegetables, whole grains, good sources of protein such as meat, eggs, or tofu,  and low-fat dairy. Your health care provider will help you determine the amount of weight gain that is right for you.  Avoid raw meat and uncooked cheese. These carry germs that can cause birth defects in the baby.  If you have low calcium intake from food, talk to your health care provider about whether you should take a daily calcium supplement.  Eat four or five small meals rather than three large meals a day.  Limit foods that are high in fat and processed sugars, such as fried and sweet foods.  To prevent constipation: ? Drink enough fluid to keep your urine clear or pale yellow. ? Eat foods that are high in fiber, such as fresh fruits and vegetables, whole grains, and beans. Activity  Exercise only as directed by your health care provider. Most women can continue their usual exercise routine during pregnancy. Try to exercise for 30 minutes at least 5 days a week. Stop exercising if you experience uterine contractions.  Avoid heavy lifting.  Do   not exercise in extreme heat or humidity, or at high altitudes.  Wear low-heel, comfortable shoes.  Practice good posture.  You may continue to have sex unless your health care provider tells you otherwise. Relieving pain and discomfort  Take frequent breaks and rest with your legs elevated if you have leg cramps or low back pain.  Take warm sitz baths to soothe any pain or discomfort caused by hemorrhoids. Use hemorrhoid cream if your health care provider approves.  Wear a good support bra to prevent discomfort from breast tenderness.  If you develop varicose veins: ? Wear support pantyhose or compression stockings as told by your healthcare provider. ? Elevate your feet for 15 minutes, 3-4 times a day. Prenatal care  Write down your questions. Take them to your prenatal visits.  Keep all your prenatal visits as told by your health care provider. This is important. Safety  Wear your seat belt at all times when driving.  Make  a list of emergency phone numbers, including numbers for family, friends, the hospital, and police and fire departments. General instructions  Avoid cat litter boxes and soil used by cats. These carry germs that can cause birth defects in the baby. If you have a cat, ask someone to clean the litter box for you.  Do not travel far distances unless it is absolutely necessary and only with the approval of your health care provider.  Do not use hot tubs, steam rooms, or saunas.  Do not drink alcohol.  Do not use any products that contain nicotine or tobacco, such as cigarettes and e-cigarettes. If you need help quitting, ask your health care provider.  Do not use any medicinal herbs or unprescribed drugs. These chemicals affect the formation and growth of the baby.  Do not douche or use tampons or scented sanitary pads.  Do not cross your legs for long periods of time.  To prepare for the arrival of your baby: ? Take prenatal classes to understand, practice, and ask questions about labor and delivery. ? Make a trial run to the hospital. ? Visit the hospital and tour the maternity area. ? Arrange for maternity or paternity leave through employers. ? Arrange for family and friends to take care of pets while you are in the hospital. ? Purchase a rear-facing car seat and make sure you know how to install it in your car. ? Pack your hospital bag. ? Prepare the baby's nursery. Make sure to remove all pillows and stuffed animals from the baby's crib to prevent suffocation.  Visit your dentist if you have not gone during your pregnancy. Use a soft toothbrush to brush your teeth and be gentle when you floss. Contact a health care provider if:  You are unsure if you are in labor or if your water has broken.  You become dizzy.  You have mild pelvic cramps, pelvic pressure, or nagging pain in your abdominal area.  You have lower back pain.  You have persistent nausea, vomiting, or  diarrhea.  You have an unusual or bad smelling vaginal discharge.  You have pain when you urinate. Get help right away if:  Your water breaks before 37 weeks.  You have regular contractions less than 5 minutes apart before 37 weeks.  You have a fever.  You are leaking fluid from your vagina.  You have spotting or bleeding from your vagina.  You have severe abdominal pain or cramping.  You have rapid weight loss or weight gain.  You have   shortness of breath with chest pain.  You notice sudden or extreme swelling of your face, hands, ankles, feet, or legs.  Your baby makes fewer than 10 movements in 2 hours.  You have severe headaches that do not go away when you take medicine.  You have vision changes. Summary  The third trimester is from week 28 through week 40, months 7 through 9. The third trimester is a time when the unborn baby (fetus) is growing rapidly.  During the third trimester, your discomfort may increase as you and your baby continue to gain weight. You may have abdominal, leg, and back pain, sleeping problems, and an increased need to urinate.  During the third trimester your breasts will keep growing and they will continue to become tender. A yellow fluid (colostrum) may leak from your breasts. This is the first milk you are producing for your baby.  False labor is a condition in which you feel small, irregular tightenings of the muscles in the womb (contractions) that eventually go away. These are called Braxton Hicks contractions. Contractions may last for hours, days, or even weeks before true labor sets in.  Signs of labor can include: abdominal cramps; regular contractions that start at 10 minutes apart and become stronger and more frequent with time; watery or bloody mucus discharge that comes from the vagina; increased pelvic pressure and dull back pain; and leaking of amniotic fluid. This information is not intended to replace advice given to you by your  health care provider. Make sure you discuss any questions you have with your health care provider. Document Released: 05/26/2001 Document Revised: 07/07/2016 Document Reviewed: 07/07/2016 Elsevier Interactive Patient Education  2019 [employer]  

## 2018-10-24 NOTE — Progress Notes (Signed)
  Subjective  Fetal Movement? yes Contractions? no Leaking Fluid? no Vaginal Bleeding? no  C/o rash between legs, itching and knots on inner legs   Objective  BP 140/84   Wt 231 lb (104.8 kg)   LMP 03/08/2018   BMI 35.12 kg/m  General: NAD Pumonary: no increased work of breathing Abdomen: gravid, non-tender Extremities: no edema Psychiatric: mood appropriate, affect full Yeast like thigh rash Assessment  23 y.o. G4P0030 at [redacted]w[redacted]d by  12/13/2018, by Last Menstrual Period presenting for routine prenatal visit  Plan    Acute cystitis with hematuria    -  Primary   Relevant Orders   Urine Culture   POCT Urinalysis Dipstick (Completed) MACROBID Tx   Need for Tdap vaccination       Relevant Orders   Tdap vaccine greater than or equal to 7yo IM (Completed)   [redacted] weeks gestation of pregnancy       Relevant Orders   PN[facility], [facility] PTL precautions      pregnancy4 Problems (from 04/07/18 to present)    Problem Noted Resolved   Supervision of low-risk pregnancy, first trimester 05/09/2018 by Natale Milch, MD No   Overview Addendum 10/03/2018  8:26 AM by Vena Austria, MD      Clinic [facility] Prenatal Labs  Dating  LMP =8wk [place] Blood type: A/Positive/-- (11/11 1446)   Genetic Screen  NIPS: desires    Antibody:Negative (11/11 1446)  Anatomic [place] complete Rubella: 1.17 (11/11 1446)  Varicella: Nonimmune  GTT 28 wk:    93  RPR: Non Reactive (11/11 1446)   Rhogam Not needed HBsAg: Negative (11/11 1446)   TDaP vaccine  10/24/2018       HIV: Non Reactive (11/11 1446)   Flu Shot   Declines                             GBS: pending  Contraception  Pap: NIL 2019  CBB     CS/VBAC N/a   Baby Food Breast/bottle   Support Person               Lotrisone for yeast like skin rash  Annamarie Major, MD, [facility], [facility] 10/24/2018  4:16 PM

## 2018-10-26 LAB — URINE CULTURE

## 2018-11-03 ENCOUNTER — Encounter: Payer: BLUE CROSS/BLUE SHIELD | Admitting: Maternal Newborn

## 2018-11-03 NOTE — Telephone Encounter (Signed)
Pt reports bad pain on right side of stomach and pain in back. Still taking the antibiotic. Please advise

## 2018-11-04 ENCOUNTER — Encounter: Payer: Self-pay | Admitting: Obstetrics and Gynecology

## 2018-11-04 ENCOUNTER — Other Ambulatory Visit: Payer: Self-pay

## 2018-11-04 ENCOUNTER — Ambulatory Visit (INDEPENDENT_AMBULATORY_CARE_PROVIDER_SITE_OTHER): Payer: BLUE CROSS/BLUE SHIELD | Admitting: Obstetrics and Gynecology

## 2018-11-04 ENCOUNTER — Other Ambulatory Visit: Payer: Self-pay | Admitting: Obstetrics and Gynecology

## 2018-11-04 ENCOUNTER — Other Ambulatory Visit (INDEPENDENT_AMBULATORY_CARE_PROVIDER_SITE_OTHER): Payer: BLUE CROSS/BLUE SHIELD

## 2018-11-04 VITALS — BP 132/80 | Wt 241.0 lb

## 2018-11-04 DIAGNOSIS — Z3A34 34 weeks gestation of pregnancy: Secondary | ICD-10-CM | POA: Diagnosis not present

## 2018-11-04 DIAGNOSIS — O288 Other abnormal findings on antenatal screening of mother: Secondary | ICD-10-CM

## 2018-11-04 DIAGNOSIS — O368390 Maternal care for abnormalities of the fetal heart rate or rhythm, unspecified trimester, not applicable or unspecified: Secondary | ICD-10-CM

## 2018-11-04 DIAGNOSIS — K219 Gastro-esophageal reflux disease without esophagitis: Secondary | ICD-10-CM

## 2018-11-04 DIAGNOSIS — Z3491 Encounter for supervision of normal pregnancy, unspecified, first trimester: Secondary | ICD-10-CM

## 2018-11-04 DIAGNOSIS — R300 Dysuria: Secondary | ICD-10-CM

## 2018-11-04 LAB — POCT URINALYSIS DIPSTICK OB: Glucose, UA: NEGATIVE

## 2018-11-04 MED ORDER — SUCRALFATE 1 G PO TABS: 1.0000 g | ORAL_TABLET | Freq: Three times a day (TID) | ORAL | 3 refills | Status: DC

## 2018-11-04 NOTE — Patient Instructions (Signed)
Back Exercises If you have pain in your back, do these exercises 2-3 times each day or as told by your doctor. When the pain goes away, do the exercises once each day, but repeat the steps more times for each exercise (do more repetitions). If you do not have pain in your back, do these exercises once each day or as told by your doctor. Exercises Single Knee to Chest Do these steps 3-5 times in a row for each leg: 1. Lie on your back on a firm bed or the floor with your legs stretched out. 2. Bring one knee to your chest. 3. Hold your knee to your chest by grabbing your knee or thigh. 4. Pull on your knee until you feel a gentle stretch in your lower back. 5. Keep doing the stretch for 10-30 seconds. 6. Slowly let go of your leg and straighten it. Pelvic Tilt Do these steps 5-10 times in a row: 1. Lie on your back on a firm bed or the floor with your legs stretched out. 2. Bend your knees so they point up to the ceiling. Your feet should be flat on the floor. 3. Tighten your lower belly (abdomen) muscles to press your lower back against the floor. This will make your tailbone point up to the ceiling instead of pointing down to your feet or the floor. 4. Stay in this position for 5-10 seconds while you gently tighten your muscles and breathe evenly. Cat-Cow Do these steps until your lower back bends more easily: 1. Get on your hands and knees on a firm surface. Keep your hands under your shoulders, and keep your knees under your hips. You may put padding under your knees. 2. Let your head hang down, and make your tailbone point down to the floor so your lower back is round like the back of a cat. 3. Stay in this position for 5 seconds. 4. Slowly lift your head and make your tailbone point up to the ceiling so your back hangs low (sags) like the back of a cow. 5. Stay in this position for 5 seconds.  Press-Ups Do these steps 5-10 times in a row: 1. Lie on your belly (face-down) on the floor.  2. Place your hands near your head, about shoulder-width apart. 3. While you keep your back relaxed and keep your hips on the floor, slowly straighten your arms to raise the top half of your body and lift your shoulders. Do not use your back muscles. To make yourself more comfortable, you may change where you place your hands. 4. Stay in this position for 5 seconds. 5. Slowly return to lying flat on the floor.  Bridges Do these steps 10 times in a row: 1. Lie on your back on a firm surface. 2. Bend your knees so they point up to the ceiling. Your feet should be flat on the floor. 3. Tighten your butt muscles and lift your butt off of the floor until your waist is almost as high as your knees. If you do not feel the muscles working in your butt and the back of your thighs, slide your feet 1-2 inches farther away from your butt. 4. Stay in this position for 3-5 seconds. 5. Slowly lower your butt to the floor, and let your butt muscles relax. If this exercise is too easy, try doing it with your arms crossed over your chest. Belly Crunches Do these steps 5-10 times in a row: 1. Lie on your back on a  firm bed or the floor with your legs stretched out. 2. Bend your knees so they point up to the ceiling. Your feet should be flat on the floor. 3. Cross your arms over your chest. 4. Tip your chin a little bit toward your chest but do not bend your neck. 5. Tighten your belly muscles and slowly raise your chest just enough to lift your shoulder blades a tiny bit off of the floor. 6. Slowly lower your chest and your head to the floor. Back Lifts Do these steps 5-10 times in a row: 1. Lie on your belly (face-down) with your arms at your sides, and rest your forehead on the floor. 2. Tighten the muscles in your legs and your butt. 3. Slowly lift your chest off of the floor while you keep your hips on the floor. Keep the back of your head in line with the curve in your back. Look at the floor while you  do this. 4. Stay in this position for 3-5 seconds. 5. Slowly lower your chest and your face to the floor. Contact a doctor if:  Your back pain gets a lot worse when you do an exercise.  Your back pain does not lessen 2 hours after you exercise. If you have any of these problems, stop doing the exercises. Do not do them again unless your doctor says it is okay. Get help right away if:  You have sudden, very bad back pain. If this happens, stop doing the exercises. Do not do them again unless your doctor says it is okay. This information is not intended to replace advice given to you by your health care provider. Make sure you discuss any questions you have with your health care provider. Document Released: 07/04/2010 Document Revised: 02/23/2018 Document Reviewed: 07/26/2014 Elsevier Interactive Patient Education  2019 [employer]     Sacroiliac Joint Dysfunction  Sacroiliac joint dysfunction is a condition that causes inflammation on one or both sides of the sacroiliac (SI) joint. The SI joint connects the lower part of the spine (sacrum) with the two upper portions of the pelvis (ilium). This condition causes deep aching or burning pain in the low back. In some cases, the pain may also spread into one or both buttocks, hips, or thighs. What are the causes? This condition may be caused by:  Pregnancy. During pregnancy, extra stress is put on the SI joints because the pelvis widens.  Injury, such as: ? Injuries from car accidents. ? Sports-related injuries. ? Work-related injuries.  Having one leg that is shorter than the other.  Conditions that affect the joints, such as: ? Rheumatoid arthritis. ? Gout. ? Psoriatic arthritis. ? Joint infection (septic arthritis). Sometimes, the cause of SI joint dysfunction is not known. What are the signs or symptoms? Symptoms of this condition include:  Aching or burning pain in the lower back. The pain may also spread to other areas,  such as: ? Buttocks. ? Groin. ? Thighs.  Muscle spasms in or around the painful areas.  Increased pain when standing, walking, running, stair climbing, bending, or lifting. How is this diagnosed? This condition is diagnosed with a physical exam and medical history. During the exam, the health care provider may move one or both of your legs to different positions to check for pain. Various tests may be done to confirm the diagnosis, including:  Imaging tests to look for other causes of pain. These may include: ? MRI. ? CT scan. ? Bone scan.  Diagnostic  injection. A numbing medicine is injected into the SI joint using a needle. If your pain is temporarily improved or stopped after the injection, this can indicate that SI joint dysfunction is the problem. How is this treated? Treatment depends on the cause and severity of your condition. Treatment options may include:  Ice or heat applied to the lower back area after an injury. This may help reduce pain and muscle spasms.  Medicines to relieve pain or inflammation or to relax the muscles.  Wearing a back brace (sacroiliac brace) to help support the joint while your back is healing.  Physical therapy to increase muscle strength around the joint and flexibility at the joint. This may also involve learning proper body positions and ways of moving to relieve stress on the joint.  Direct manipulation of the SI joint.  Injections of steroid medicine into the joint to reduce pain and swelling.  Radiofrequency ablation to burn away nerves that are carrying pain messages from the joint.  Use of a device that provides electrical stimulation to help reduce pain at the joint.  Surgery to put in screws and plates that limit or prevent joint motion. This is rare. Follow these instructions at home: Medicines  Take over-the-counter and prescription medicines only as told by your health care provider.  Do not drive or use heavy machinery while  taking prescription pain medicine.  If you are taking prescription pain medicine, take actions to prevent or treat constipation. Your health care provider may recommend that you: ? Drink enough fluid to keep your urine pale yellow. ? Eat foods that are high in fiber, such as fresh fruits and vegetables, whole grains, and beans. ? Limit foods that are high in fat and processed sugars, such as fried or sweet foods. ? Take an over-the-counter or prescription medicine for constipation. If you have a brace:  Wear the brace as told by your health care provider. Remove it only as told by your health care provider.  Keep the brace clean.  If the brace is not waterproof: ? Do not let it get wet. ? Cover it with a watertight covering when you take a bath or a shower. Managing pain, stiffness, and swelling      Icing can help with pain and swelling. Heat may help with muscle tension or spasms. Ask your health care provider if you should use ice or heat.  If directed, put ice on the affected area: ? If you have a removable brace, remove it as told by your health care provider. ? Put ice in a plastic bag. ? Place a towel between your skin and the bag. ? Leave the ice on for 20 minutes, 2-3 times a day.  If directed, apply heat to the affected area. Use the heat source that your health care provider recommends, such as a moist heat pack or a heating pad. ? Place a towel between your skin and the heat source. ? Leave the heat on for 20-30 minutes. ? Remove the heat if your skin turns bright red. This is especially important if you are unable to feel pain, heat, or cold. You may have a greater risk of getting burned. General instructions  Rest as needed. Ask your health care provider what activities are safe for you.  Return to your normal activities as told by your health care provider.  Exercise as directed by your health care provider or physical therapist.  Do not use any products that  contain nicotine or tobacco,  such as cigarettes and e-cigarettes. These can delay bone healing. If you need help quitting, ask your health care provider.  Keep all follow-up visits as told by your health care provider. This is important. Contact a health care provider if:  Your pain is not controlled with medicine.  You have a fever.  Your pain is getting worse. Get help right away if:  You have weakness, numbness, or tingling in your legs or feet.  You lose control of your bladder or bowel. Summary  Sacroiliac joint dysfunction is a condition that causes inflammation on one or both sides of the sacroiliac (SI) joint.  This condition causes deep aching or burning pain in the low back. In some cases, the pain may also spread into one or both buttocks, hips, or thighs.  Treatment depends on the cause and severity of your condition. It may include medicines to reduce pain and swelling or to relax muscles. This information is not intended to replace advice given to you by your health care provider. Make sure you discuss any questions you have with your health care provider. Document Released: 08/28/2008 Document Revised: 07/12/2017 Document Reviewed: 07/12/2017 Elsevier Interactive Patient Education  2019 [employer].   Administrator, arts refers to the movements and positions of your body while you do your daily activities. Posture is part of body mechanics. Good posture and healthy body mechanics can help to relieve stress in your body's tissues and joints. Good posture means that your spine is in its natural S-curve position (your spine is neutral), your shoulders are pulled back slightly, and your head is not tipped forward. The following are general guidelines for applying improved posture and body mechanics to your everyday activities. Standing   When standing, keep your spine neutral and your feet about hip-width apart. Keep a slight bend in your knees.  Your ears, shoulders, and hips should line up.  When you do a task in which you stand in one place for a long time, place one foot up on a stable object that is 2-4 inches (5-10 cm) high, such as a footstool. This helps keep your spine neutral. Sitting   When sitting, keep your spine neutral and keep your feet flat on the floor. Use a footrest, if necessary, and keep your thighs parallel to the floor. Avoid rounding your shoulders, and avoid tilting your head forward.  When working at a desk or a computer, keep your desk at a height where your hands are slightly lower than your elbows. Slide your chair under your desk so you are close enough to maintain good posture.  When working at a computer, place your monitor at a height where you are looking straight ahead and you do not have to tilt your head forward or downward to look at the screen. Resting   When lying down and resting, avoid positions that are most painful for you.  If you have pain with activities such as sitting, bending, stooping, or squatting (flexion-based activities), lie in a position in which your body does not bend very much. For example, avoid curling up on your side with your arms and knees near your chest (fetal position).  If you have pain with activities such as standing for a long time or reaching with your arms (extension-based activities), lie with your spine in a neutral position and bend your knees slightly. Try the following positions: ? Lying on your side with a pillow between your knees. ? Lying on  your back with a pillow under your knees. Lifting   When lifting objects, keep your feet at least shoulder-width apart and tighten your abdominal muscles.  Bend your knees and hips and keep your spine neutral. It is important to lift using the strength of your legs, not your back. Do not lock your knees straight out.  Always ask for help to lift heavy or awkward objects. This information is not intended to  replace advice given to you by your health care provider. Make sure you discuss any questions you have with your health care provider. Document Released: 06/01/2005 Document Revised: 02/06/2016 Document Reviewed: 02/15/2015 Elsevier Interactive Patient Education  2019 [employer]

## 2018-11-04 NOTE — Progress Notes (Signed)
Routine Prenatal Care Visit  Subjective  Barbara Quinn is a 23 y.o. G4P0030 at [redacted]w[redacted]d being seen today for ongoing prenatal care.  She is currently monitored for the following issues for this low-risk pregnancy and has Supervision of low-risk pregnancy, first trimester on their problem list.  ----------------------------------------------------------------------------------- Patient reports backache.  Shooting pain in low back especially if she puts more weight on the right foot. No shooting pain down her legs. Denies fevers, denies pain with urination. Denies increased urinary frequency.  Contractions: Not present. Vag. Bleeding: None.  Movement: Present. Denies leaking of fluid.  ----------------------------------------------------------------------------------- The following portions of the patient's history were reviewed and updated as appropriate: allergies, current medications, past family history, past medical history, past social history, past surgical history and problem list. Problem list updated.   Objective  Blood pressure 132/80, weight 241 lb (109.3 kg), last menstrual period 03/08/2018. Pregravid weight 197 lb (89.4 kg) Total Weight Gain 44 lb (20 kg) Urinalysis:      Fetal Status: Fetal Heart Rate (bpm): 130 Fundal Height: 36 cm Movement: Present     General:  Alert, oriented and cooperative. Patient is in no acute distress.  Skin: Skin is warm and dry. No rash noted.   Cardiovascular: Normal heart rate noted  Respiratory: Normal respiratory effort, no problems with respiration noted  Abdomen: Soft, gravid, appropriate for gestational age. Pain/Pressure: Present     Pelvic:  Cervical exam deferred        Extremities: Normal range of motion.  Edema: None  Mental Status: Normal mood and affect. Normal behavior. Normal judgment and thought content.  Pain and tenderness at SI joint. No CVA tenderness.     Assessment   22 y.o. G4P0030 at [redacted]w[redacted]d by  12/13/2018, by  Last Menstrual Period presenting for routine prenatal visit  Plan   pregnancy4 Problems (from 04/07/18 to present)    Problem Noted Resolved   Supervision of low-risk pregnancy, first trimester 05/09/2018 by Natale Milch, MD No   Overview Addendum 10/03/2018  8:26 AM by Vena Austria, MD      [facility]inic [facility] Prenatal Labs  Dating  LMP =8wk [place] Blood type: A/Positive/-- (11/11 1446)   Genetic Screen  NIPS: desires    Antibody:Negative (11/11 1446)  Anatomic [place] complete Rubella: 1.17 (11/11 1446)  Varicella: Nonimmune  GTT 28 wk:    93  RPR: Non Reactive (11/11 1446)   Rhogam Not needed HBsAg: Negative (11/11 1446)   TDaP vaccine    [ ]  would like at 32 wks       HIV: Non Reactive (11/11 1446)   Flu Shot   Declines                             GBS:   Contraception  Pap: NIL 2019  CBB     CS/VBAC    Baby Food Breast/bottle   Support Person                 Gestational age appropriate obstetric precautions including but not limited to vaginal bleeding, contractions, leaking of fluid and fetal movement were reviewed in detail with the patient.    SI joint pain- discussed supportive care and back exercises Urine culture sent Fetal arrhythmia hear- NST Continues to measure   Return in about 2 weeks (around 11/18/2018) for ROB in person.  Natale Milch MD [facility], [facility] 11/04/2018, 8:28 AM

## 2018-11-04 NOTE — Progress Notes (Signed)
ROB C/o Pain on right side of of belly button, and back pain all over  Denies lof, no vb, Good FM

## 2018-11-07 LAB — URINE CULTURE: Organism ID, Bacteria: NO GROWTH

## 2018-11-08 ENCOUNTER — Other Ambulatory Visit: Payer: Self-pay | Admitting: Obstetrics and Gynecology

## 2018-11-08 ENCOUNTER — Encounter: Payer: BLUE CROSS/BLUE SHIELD | Admitting: Maternal Newborn

## 2018-11-08 NOTE — Progress Notes (Signed)
Negative, Released to mychart 

## 2018-11-08 NOTE — Progress Notes (Signed)
Discussed pericardial effusion with patient on the phone.

## 2018-11-18 ENCOUNTER — Ambulatory Visit (INDEPENDENT_AMBULATORY_CARE_PROVIDER_SITE_OTHER): Payer: BC Managed Care – PPO | Admitting: Maternal Newborn

## 2018-11-18 ENCOUNTER — Other Ambulatory Visit (HOSPITAL_COMMUNITY)
Admission: RE | Admit: 2018-11-18 | Discharge: 2018-11-18 | Disposition: A | Discharge status: Home / Self Care | Payer: BC Managed Care – PPO | Source: Ambulatory Visit | Attending: Maternal Newborn | Admitting: Maternal Newborn

## 2018-11-18 ENCOUNTER — Other Ambulatory Visit: Payer: Self-pay

## 2018-11-18 ENCOUNTER — Encounter: Payer: Self-pay | Admitting: Maternal Newborn

## 2018-11-18 VITALS — BP 116/70 | Wt 237.8 lb

## 2018-11-18 DIAGNOSIS — Z3491 Encounter for supervision of normal pregnancy, unspecified, first trimester: Secondary | ICD-10-CM | POA: Diagnosis present

## 2018-11-18 DIAGNOSIS — Z113 Encounter for screening for infections with a predominantly sexual mode of transmission: Secondary | ICD-10-CM

## 2018-11-18 DIAGNOSIS — Z3A36 36 weeks gestation of pregnancy: Secondary | ICD-10-CM

## 2018-11-18 DIAGNOSIS — Z3483 Encounter for supervision of other normal pregnancy, third trimester: Secondary | ICD-10-CM

## 2018-11-18 LAB — OB RESULTS CONSOLE GC/CHLAMYDIA: Gonorrhea: NEGATIVE

## 2018-11-18 LAB — POCT URINALYSIS DIPSTICK OB
Glucose, UA: NEGATIVE
POC,PROTEIN,UA: NEGATIVE

## 2018-11-18 NOTE — Progress Notes (Signed)
    Routine Prenatal Care Visit  Subjective  Barbara Quinn is a 23 y.o. G4P0030 at [redacted]w[redacted]d being seen today for ongoing prenatal care.  She is currently monitored for the following issues for this low-risk pregnancy and has Supervision of low-risk pregnancy, first trimester on their problem list.  ----------------------------------------------------------------------------------- Patient reports intermittent brief pains in her vaginal area for the last couple of days.   Contractions: Not present. Vag. Bleeding: None.  Movement: Present. No leaking of fluid.  ----------------------------------------------------------------------------------- The following portions of the patient's history were reviewed and updated as appropriate: allergies, current medications, past family history, past medical history, past social history, past surgical history and problem list. Problem list updated.  Objective  Blood pressure 116/70, weight 237 lb 12.8 oz (107.9 kg), last menstrual period 03/08/2018. Pregravid weight 197 lb (89.4 kg) Total Weight Gain 40 lb 12.8 oz (18.5 kg) Urinalysis: Urine dipstick shows negative for glucose, protein.  Fetal Status:   Fundal Height: 37 cm Movement: Present     General:  Alert, oriented and cooperative. Patient is in no acute distress.  Skin: Skin is warm and dry. No rash noted.   Cardiovascular: Normal heart rate noted  Respiratory: Normal respiratory effort, no problems with respiration noted  Abdomen: Soft, gravid, appropriate for gestational age. Pain/Pressure: Present     Pelvic:  Cervical exam performed Dilation: Closed Effacement (%): Thick Station: Ballotable  Extremities: Normal range of motion.  Edema: None  Mental Status: Normal mood and affect. Normal behavior. Normal judgment and thought content.     Assessment   22 y.o. G4P0030 at [redacted]w[redacted]d, EDD 12/13/2018 by Last Menstrual Period presenting for a routine prenatal visit.  Plan   pregnancy4 Problems  (from 04/07/18 to present)    Problem Noted Resolved   Supervision of low-risk pregnancy, first trimester 05/09/2018 by Natale Milch, MD No   Overview Addendum 11/04/2018  8:46 AM by Natale Milch, MD      [facility]inic [facility] Prenatal Labs  Dating  LMP =8wk [place] Blood type: A/Positive/-- (11/11 1446)   Genetic Screen  NIPS: desires    Antibody:Negative (11/11 1446)  Anatomic [place] complete Rubella: 1.17 (11/11 1446)  Varicella: Nonimmune  GTT 28 wk:    93  RPR: Non Reactive (11/11 1446)   Rhogam Not needed HBsAg: Negative (11/11 1446)   TDaP vaccine   10/24/2018     HIV: Non Reactive (11/11 1446)   Flu Shot   Declines                             GBS:   Contraception  Pap: NIL 2019  CBB     CS/VBAC    Baby Food Breast/bottle   Support Person                GBS and Aptima today.  Preterm labor symptoms and general obstetric precautions including but not limited to vaginal bleeding, contractions, leaking of fluid and fetal movement were reviewed.  Please refer to After Visit Summary for other counseling recommendations.   Return in about 1 week (around 11/25/2018) for ROB.  Marcelyn Bruins, CNM 11/18/2018  4:17 PM

## 2018-11-18 NOTE — Patient Instructions (Signed)
Third Trimester of Pregnancy The third trimester is from week 28 through week 40 (months 7 through 9). The third trimester is a time when the unborn baby (fetus) is growing rapidly. At the end of the ninth month, the fetus is about 20 inches in length and weighs 6-10 pounds. Body changes during your third trimester Your body will continue to go through many changes during pregnancy. The changes vary from woman to woman. During the third trimester:  Your weight will continue to increase. You can expect to gain 25-35 pounds (11-16 kg) by the end of the pregnancy.  You may begin to get stretch marks on your hips, abdomen, and breasts.  You may urinate more often because the fetus is moving lower into your pelvis and pressing on your bladder.  You may develop or continue to have heartburn. This is caused by increased hormones that slow down muscles in the digestive tract.  You may develop or continue to have constipation because increased hormones slow digestion and cause the muscles that push waste through your intestines to relax.  You may develop hemorrhoids. These are swollen veins (varicose veins) in the rectum that can itch or be painful.  You may develop swollen, bulging veins (varicose veins) in your legs.  You may have increased body aches in the pelvis, back, or thighs. This is due to weight gain and increased hormones that are relaxing your joints.  You may have changes in your hair. These can include thickening of your hair, rapid growth, and changes in texture. Some women also have hair loss during or after pregnancy, or hair that feels dry or thin. Your hair will most likely return to normal after your baby is born.  Your breasts will continue to grow and they will continue to become tender. A yellow fluid (colostrum) may leak from your breasts. This is the first milk you are producing for your baby.  Your belly button may stick out.  You may notice more swelling in your hands,  face, or ankles.  You may have increased tingling or numbness in your hands, arms, and legs. The skin on your belly may also feel numb.  You may feel short of breath because of your expanding uterus.  You may have more problems sleeping. This can be caused by the size of your belly, increased need to urinate, and an increase in your body's metabolism.  You may notice the fetus "dropping," or moving lower in your abdomen (lightening).  You may have increased vaginal discharge.  You may notice your joints feel loose and you may have pain around your pelvic bone. What to expect at prenatal visits You will have prenatal exams every 2 weeks until week 36. Then you will have weekly prenatal exams. During a routine prenatal visit:  You will be weighed to make sure you and the baby are growing normally.  Your blood pressure will be taken.  Your abdomen will be measured to track your baby's growth.  The fetal heartbeat will be listened to.  Any test results from the previous visit will be discussed.  You may have a cervical check near your due date to see if your cervix has softened or thinned (effaced).  You will be tested for Group B streptococcus. This happens between 35 and 37 weeks. Your health care provider may ask you:  What your birth plan is.  How you are feeling.  If you are feeling the baby move.  If you have had any abnormal   symptoms, such as leaking fluid, bleeding, severe headaches, or abdominal cramping.  If you are using any tobacco products, including cigarettes, chewing tobacco, and electronic cigarettes.  If you have any questions. Other tests or screenings that may be performed during your third trimester include:  Blood tests that check for low iron levels (anemia).  Fetal testing to check the health, activity level, and growth of the fetus. Testing is done if you have certain medical conditions or if there are problems during the pregnancy.  Nonstress test  (NST). This test checks the health of your baby to make sure there are no signs of problems, such as the baby not getting enough oxygen. During this test, a belt is placed around your belly. The baby is made to move, and its heart rate is monitored during movement. What is false labor? False labor is a condition in which you feel small, irregular tightenings of the muscles in the womb (contractions) that usually go away with rest, changing position, or drinking water. These are called Braxton Hicks contractions. Contractions may last for hours, days, or even weeks before true labor sets in. If contractions come at regular intervals, become more frequent, increase in intensity, or become painful, you should see your health care provider. What are the signs of labor?  Abdominal cramps.  Regular contractions that start at 10 minutes apart and become stronger and more frequent with time.  Contractions that start on the top of the uterus and spread down to the lower abdomen and back.  Increased pelvic pressure and dull back pain.  A watery or bloody mucus discharge that comes from the vagina.  Leaking of amniotic fluid. This is also known as your "water breaking." It could be a slow trickle or a gush. Let your health care provider know if it has a color or strange odor. If you have any of these signs, call your health care provider right away, even if it is before your due date. Follow these instructions at home: Medicines  Follow your health care provider's instructions regarding medicine use. Specific medicines may be either safe or unsafe to take during pregnancy.  Take a prenatal vitamin that contains at least 600 micrograms (mcg) of folic acid.  If you develop constipation, try taking a stool softener if your health care provider approves. Eating and drinking   Eat a balanced diet that includes fresh fruits and vegetables, whole grains, good sources of protein such as meat, eggs, or tofu,  and low-fat dairy. Your health care provider will help you determine the amount of weight gain that is right for you.  Avoid raw meat and uncooked cheese. These carry germs that can cause birth defects in the baby.  If you have low calcium intake from food, talk to your health care provider about whether you should take a daily calcium supplement.  Eat four or five small meals rather than three large meals a day.  Limit foods that are high in fat and processed sugars, such as fried and sweet foods.  To prevent constipation: ? Drink enough fluid to keep your urine clear or pale yellow. ? Eat foods that are high in fiber, such as fresh fruits and vegetables, whole grains, and beans. Activity  Exercise only as directed by your health care provider. Most women can continue their usual exercise routine during pregnancy. Try to exercise for 30 minutes at least 5 days a week. Stop exercising if you experience uterine contractions.  Avoid heavy lifting.  Do   not exercise in extreme heat or humidity, or at high altitudes.  Wear low-heel, comfortable shoes.  Practice good posture.  You may continue to have sex unless your health care provider tells you otherwise. Relieving pain and discomfort  Take frequent breaks and rest with your legs elevated if you have leg cramps or low back pain.  Take warm sitz baths to soothe any pain or discomfort caused by hemorrhoids. Use hemorrhoid cream if your health care provider approves.  Wear a good support bra to prevent discomfort from breast tenderness.  If you develop varicose veins: ? Wear support pantyhose or compression stockings as told by your healthcare provider. ? Elevate your feet for 15 minutes, 3-4 times a day. Prenatal care  Write down your questions. Take them to your prenatal visits.  Keep all your prenatal visits as told by your health care provider. This is important. Safety  Wear your seat belt at all times when driving.  Make  a list of emergency phone numbers, including numbers for family, friends, the hospital, and police and fire departments. General instructions  Avoid cat litter boxes and soil used by cats. These carry germs that can cause birth defects in the baby. If you have a cat, ask someone to clean the litter box for you.  Do not travel far distances unless it is absolutely necessary and only with the approval of your health care provider.  Do not use hot tubs, steam rooms, or saunas.  Do not drink alcohol.  Do not use any products that contain nicotine or tobacco, such as cigarettes and e-cigarettes. If you need help quitting, ask your health care provider.  Do not use any medicinal herbs or unprescribed drugs. These chemicals affect the formation and growth of the baby.  Do not douche or use tampons or scented sanitary pads.  Do not cross your legs for long periods of time.  To prepare for the arrival of your baby: ? Take prenatal classes to understand, practice, and ask questions about labor and delivery. ? Make a trial run to the hospital. ? Visit the hospital and tour the maternity area. ? Arrange for maternity or paternity leave through employers. ? Arrange for family and friends to take care of pets while you are in the hospital. ? Purchase a rear-facing car seat and make sure you know how to install it in your car. ? Pack your hospital bag. ? Prepare the baby's nursery. Make sure to remove all pillows and stuffed animals from the baby's crib to prevent suffocation.  Visit your dentist if you have not gone during your pregnancy. Use a soft toothbrush to brush your teeth and be gentle when you floss. Contact a health care provider if:  You are unsure if you are in labor or if your water has broken.  You become dizzy.  You have mild pelvic cramps, pelvic pressure, or nagging pain in your abdominal area.  You have lower back pain.  You have persistent nausea, vomiting, or  diarrhea.  You have an unusual or bad smelling vaginal discharge.  You have pain when you urinate. Get help right away if:  Your water breaks before 37 weeks.  You have regular contractions less than 5 minutes apart before 37 weeks.  You have a fever.  You are leaking fluid from your vagina.  You have spotting or bleeding from your vagina.  You have severe abdominal pain or cramping.  You have rapid weight loss or weight gain.  You have   shortness of breath with chest pain.  You notice sudden or extreme swelling of your face, hands, ankles, feet, or legs.  Your baby makes fewer than 10 movements in 2 hours.  You have severe headaches that do not go away when you take medicine.  You have vision changes. Summary  The third trimester is from week 28 through week 40, months 7 through 9. The third trimester is a time when the unborn baby (fetus) is growing rapidly.  During the third trimester, your discomfort may increase as you and your baby continue to gain weight. You may have abdominal, leg, and back pain, sleeping problems, and an increased need to urinate.  During the third trimester your breasts will keep growing and they will continue to become tender. A yellow fluid (colostrum) may leak from your breasts. This is the first milk you are producing for your baby.  False labor is a condition in which you feel small, irregular tightenings of the muscles in the womb (contractions) that eventually go away. These are called Braxton Hicks contractions. Contractions may last for hours, days, or even weeks before true labor sets in.  Signs of labor can include: abdominal cramps; regular contractions that start at 10 minutes apart and become stronger and more frequent with time; watery or bloody mucus discharge that comes from the vagina; increased pelvic pressure and dull back pain; and leaking of amniotic fluid. This information is not intended to replace advice given to you by your  health care provider. Make sure you discuss any questions you have with your health care provider. Document Released: 05/26/2001 Document Revised: 07/07/2016 Document Reviewed: 07/07/2016 Elsevier Interactive Patient Education  2019 [employer]  

## 2018-11-18 NOTE — Progress Notes (Signed)
ROB/GBS- cramping in vaginal area for about two days

## 2018-11-20 ENCOUNTER — Observation Stay
Admission: EM | Admit: 2018-11-20 | Discharge: 2018-11-21 | Disposition: A | Discharge status: Home / Self Care | Payer: BC Managed Care – PPO | Attending: Obstetrics & Gynecology | Admitting: Obstetrics & Gynecology

## 2018-11-20 ENCOUNTER — Other Ambulatory Visit: Payer: Self-pay

## 2018-11-20 DIAGNOSIS — Z3A36 36 weeks gestation of pregnancy: Secondary | ICD-10-CM | POA: Insufficient documentation

## 2018-11-20 DIAGNOSIS — O2343 Unspecified infection of urinary tract in pregnancy, third trimester: Secondary | ICD-10-CM | POA: Diagnosis not present

## 2018-11-20 DIAGNOSIS — R1030 Lower abdominal pain, unspecified: Secondary | ICD-10-CM | POA: Diagnosis present

## 2018-11-20 DIAGNOSIS — Z3491 Encounter for supervision of normal pregnancy, unspecified, first trimester: Secondary | ICD-10-CM

## 2018-11-20 DIAGNOSIS — Z3490 Encounter for supervision of normal pregnancy, unspecified, unspecified trimester: Secondary | ICD-10-CM

## 2018-11-20 LAB — STREP GP B NAA: Strep Gp B NAA: POSITIVE — AB

## 2018-11-20 NOTE — OB Triage Note (Signed)
Pt is a G4P0 seen at 36wk5d w/ c/o of abdominal cramping and vaginal bleeding. Pt has been experiencing abdominal cramping for the past three days and noticed vaginal bleeding around 2000. Pt only notices bleeding when she wipes and in her urine.  Pt rates her pain a 6/10. FHt deines LOF. FHT 137. Monitors applied and assessing will continue to monitor.

## 2018-11-20 NOTE — Discharge Instructions (Signed)
Pregnancy and Urinary Tract Infection  What is a urinary tract infection?    A urinary tract infection (UTI) is an infection of any part of the urinary tract. This includes the kidneys, the tubes that connect your kidneys to your bladder (ureters), the bladder, and the tube that carries urine out of your body (urethra). These organs make, store, and get rid of urine in the body.   An upper UTI affects the ureters and kidneys (pyelonephritis), and a lower UTI affects the bladder (cystitis) and urethra (urethritis). Most urinary tract infections are caused by bacteria in your genital area, around the entrance to your urinary tract (urethra). These bacteria grow and cause irritation and inflammation of your urinary tract.  Why am I more likely to get a UTI during pregnancy?  You are more likely to develop a UTI during pregnancy because:  · The physical and hormonal changes your body goes through can make it easier for bacteria to get into your urinary tract.  · Your growing baby puts pressure on your uterus and can affect urine flow.  Does a UTI place my baby at risk?  An untreated UTI during pregnancy could lead to a kidney infection, which can cause health problems that could affect your baby. Possible complications of an untreated UTI include:  · Having your baby before 37 weeks of pregnancy (premature).  · Having a baby with a low birth weight.  · Developing high blood pressure during pregnancy (preeclampsia).  · Having a low hemoglobin level (anemia).  What are the symptoms of a UTI?  Symptoms of a UTI include:  · Needing to urinate right away (urgently).  · Frequent urination or passing small amounts of urine frequently.  · Pain or burning with urination.  · Blood in the urine.  · Urine that smells bad or unusual.  · Trouble urinating.  · Cloudy urine.  · Pain in the abdomen or lower back.  · Vaginal discharge.  You may also have:  · Vomiting or a decreased appetite.  · Confusion.  · Irritability or  tiredness.  · A fever.  · Diarrhea.  What are the treatment options for a UTI during pregnancy?  Treatment for this condition may include:  · Antibiotic medicines that are safe to take during pregnancy.  · Other medicines to treat less common causes of UTI.  How can I prevent a UTI?  To prevent a UTI:  · Go to the bathroom as soon as you feel the need. Do not hold urine for long periods of time.  · Always wipe from front to back after a bowel movement. Use each tissue one time when you wipe.  · Empty your bladder after sex.  · Keep your genital area dry.  · Drink 6-10 glasses of water each day.  · Do not douche or use deodorant sprays.  Contact a health care provider if:  · Your symptoms do not improve or they get worse.  · You have abnormal vaginal discharge.  Get help right away if:  · You have a fever.  · You have nausea and vomiting.  · You have back or side pain.  · You feel contractions in your uterus.  · You have lower belly pain.  · You have a gush of fluid from your vagina.  · You have blood in your urine.  Summary  · A urinary tract infection (UTI) is an infection of any part of the urinary tract, which includes the kidneys, ureters,   bladder, and urethra.  · Most urinary tract infections are caused by bacteria in your genital area, around the entrance to your urinary tract (urethra).  · You are more likely to develop a UTI during pregnancy.  · If you were prescribed an antibiotic, take it as told by your health care provider. Do not stop taking the antibiotic even if you start to feel better.  This information is not intended to replace advice given to you by your health care provider. Make sure you discuss any questions you have with your health care provider.  Document Released: 09/26/2010 Document Revised: 07/27/2017 Document Reviewed: 04/22/2015  Elsevier Interactive Patient Education © 2019 Elsevier Inc.

## 2018-11-21 DIAGNOSIS — O26893 Other specified pregnancy related conditions, third trimester: Secondary | ICD-10-CM

## 2018-11-21 DIAGNOSIS — Z3A36 36 weeks gestation of pregnancy: Secondary | ICD-10-CM

## 2018-11-21 DIAGNOSIS — O2343 Unspecified infection of urinary tract in pregnancy, third trimester: Secondary | ICD-10-CM | POA: Diagnosis not present

## 2018-11-21 DIAGNOSIS — R109 Unspecified abdominal pain: Secondary | ICD-10-CM

## 2018-11-21 LAB — URINALYSIS, ROUTINE W REFLEX MICROSCOPIC
Bilirubin Urine: NEGATIVE
Glucose, UA: NEGATIVE mg/dL
Hgb urine dipstick: NEGATIVE
Ketones, ur: NEGATIVE mg/dL
Nitrite: NEGATIVE
Protein, ur: NEGATIVE mg/dL
Specific Gravity, Urine: 1.02 (ref 1.005–1.030)
pH: 5 (ref 5.0–8.0)

## 2018-11-21 MED ORDER — NITROFURANTOIN MONOHYD MACRO 100 MG PO CAPS: 100.0000 mg | ORAL_CAPSULE | Freq: Two times a day (BID) | ORAL | 0 refills | Status: DC

## 2018-11-21 NOTE — Final Progress Note (Signed)
Physician Final Progress Note  Patient ID: Barbara Quinn MRN: [id number] DOB/AGE: [birth year] 23 y.o.  Admit date: 11/20/2018 Admitting provider: Gae Dry, MD Discharge date: 11/21/2018  Admission Diagnoses: Lower abdominal pain, 36 weeks  Discharge Diagnoses:  Active Problems:   Pregnancy  UTI  Consults: None  Significant Findings/ Diagnostic Studies: Patient presented for evaluation of labor.  Patient had cervical exam by RN and this was reported to me. I reviewed her vital signs and fetal tracing, both of which were reassuring.  Patient was discharge as she was not laboring. UTI diagnosed  Procedures: UA pos for WBCs  A NST procedure was performed with FHR monitoring and a normal baseline established, appropriate time of 20-40 minutes of evaluation, and accels >2 seen w 15x15 characteristics.  Results show a REACTIVE NST.   Discharge Condition: good  Disposition: Discharge disposition: 01-Home or Self Care       Diet: Regular diet  Discharge Activity: Activity as tolerated  Discharge Instructions    Call MD for:   Complete by:  As directed    Worsening contractions or pain; leakage of fluid; bleeding.   Diet general   Complete by:  As directed    Increase activity slowly   Complete by:  As directed      Allergies as of 11/21/2018   No Known Allergies     Medication List    TAKE these medications   acetaminophen 500 MG tablet Commonly known as:  TYLENOL Take 500 mg by mouth every 6 (six) hours as needed.   clotrimazole-betamethasone cream Commonly known as:  Lotrisone Apply 1 application topically 2 (two) times daily.   nitrofurantoin (macrocrystal-monohydrate) 100 MG capsule Commonly known as:  Macrobid Take 1 capsule (100 mg total) by mouth 2 (two) times daily.   sucralfate 1 g tablet Commonly known as:  Carafate Take 1 tablet (1 g total) by mouth 4 (four) times daily -  with meals and at bedtime.        Total time spent taking care  of this patient: TRIAGE  Signed: Hoyt Koch 11/21/2018, 12:14 AM

## 2018-11-21 NOTE — Discharge Summary (Signed)
  See FPN 

## 2018-11-22 LAB — CERVICOVAGINAL ANCILLARY ONLY
Chlamydia: NEGATIVE
Neisseria Gonorrhea: NEGATIVE

## 2018-11-28 ENCOUNTER — Encounter: Payer: Self-pay | Admitting: Obstetrics and Gynecology

## 2018-11-28 ENCOUNTER — Ambulatory Visit (INDEPENDENT_AMBULATORY_CARE_PROVIDER_SITE_OTHER): Payer: BC Managed Care – PPO | Admitting: Obstetrics and Gynecology

## 2018-11-28 ENCOUNTER — Other Ambulatory Visit: Payer: Self-pay

## 2018-11-28 VITALS — BP 142/88 | Wt 243.0 lb

## 2018-11-28 DIAGNOSIS — Z3A37 37 weeks gestation of pregnancy: Secondary | ICD-10-CM

## 2018-11-28 DIAGNOSIS — Z3491 Encounter for supervision of normal pregnancy, unspecified, first trimester: Secondary | ICD-10-CM

## 2018-11-28 DIAGNOSIS — O26843 Uterine size-date discrepancy, third trimester: Secondary | ICD-10-CM

## 2018-11-28 LAB — POCT URINALYSIS DIPSTICK OB
Glucose, UA: NEGATIVE
POC,PROTEIN,UA: NEGATIVE

## 2018-11-28 NOTE — Progress Notes (Signed)
ROB  No concerns Denies lof, no vb, Good FM 

## 2018-11-28 NOTE — Progress Notes (Signed)
    Routine Prenatal Care Visit  Subjective  Barbara Quinn is a 23 y.o. G4P0030 at [redacted]w[redacted]d being seen today for ongoing prenatal care.  She is currently monitored for the following issues for this low-risk pregnancy and has Supervision of low-risk pregnancy, first trimester and Pregnancy on their problem list.  ----------------------------------------------------------------------------------- Patient reports no complaints.   Contractions: Not present. Vag. Bleeding: None.  Movement: Present. Denies leaking of fluid.  ----------------------------------------------------------------------------------- The following portions of the patient's history were reviewed and updated as appropriate: allergies, current medications, past family history, past medical history, past social history, past surgical history and problem list. Problem list updated.   Objective  Blood pressure (!) 142/88, weight 243 lb (110.2 kg), last menstrual period 03/08/2018. Pregravid weight 197 lb (89.4 kg) Total Weight Gain 46 lb (20.9 kg) Urinalysis:      Fetal Status: Fetal Heart Rate (bpm): 125 Fundal Height: 42 cm Movement: Present     General:  Alert, oriented and cooperative. Patient is in no acute distress.  Skin: Skin is warm and dry. No rash noted.   Cardiovascular: Normal heart rate noted  Respiratory: Normal respiratory effort, no problems with respiration noted  Abdomen: Soft, gravid, appropriate for gestational age. Pain/Pressure: Absent     Pelvic:  Cervical exam deferred        Extremities: Normal range of motion.  Edema: None  Mental Status: Normal mood and affect. Normal behavior. Normal judgment and thought content.     Assessment   23 y.o. G4P0030 at [redacted]w[redacted]d by  12/13/2018, by Last Menstrual Period presenting for routine prenatal visit  Plan   pregnancy4 Problems (from 04/07/18 to present)    Problem Noted Resolved   Supervision of low-risk pregnancy, first trimester 05/09/2018 by Homero Fellers, MD No   Overview Addendum 11/04/2018  8:46 AM by Homero Fellers, MD      Clinic [facility] Prenatal Labs  Dating  LMP =8wk [place] Blood type: A/Positive/-- (11/11 1446)   Genetic Screen  NIPS: desires    Antibody:Negative (11/11 1446)  Anatomic [place] complete Rubella: 1.17 (11/11 1446)  Varicella: Nonimmune  GTT 28 wk:    93  RPR: Non Reactive (11/11 1446)   Rhogam Not needed HBsAg: Negative (11/11 1446)   TDaP vaccine   10/24/2018     HIV: Non Reactive (11/11 1446)   Flu Shot   Declines                             GBS:   Contraception  Pap: NIL 2019  CBB     CS/VBAC    Baby Food Breast/bottle   Support Person                 Gestational age appropriate obstetric precautions including but not limited to vaginal bleeding, contractions, leaking of fluid and fetal movement were reviewed in detail with the patient.    Growth Korea at next visit for size > dates  Return in about 1 week (around 12/05/2018) for ROB in person and [place].  Homero Fellers MD [facility], [facility] 11/28/2018, 4:54 PM

## 2018-12-06 ENCOUNTER — Encounter: Payer: Self-pay | Admitting: Obstetrics and Gynecology

## 2018-12-06 ENCOUNTER — Ambulatory Visit (INDEPENDENT_AMBULATORY_CARE_PROVIDER_SITE_OTHER): Payer: BC Managed Care – PPO

## 2018-12-06 ENCOUNTER — Ambulatory Visit (INDEPENDENT_AMBULATORY_CARE_PROVIDER_SITE_OTHER): Payer: BC Managed Care – PPO | Admitting: Obstetrics and Gynecology

## 2018-12-06 ENCOUNTER — Other Ambulatory Visit: Payer: Self-pay

## 2018-12-06 VITALS — BP 130/70 | Wt 245.4 lb

## 2018-12-06 DIAGNOSIS — O26843 Uterine size-date discrepancy, third trimester: Secondary | ICD-10-CM

## 2018-12-06 DIAGNOSIS — Z3A39 39 weeks gestation of pregnancy: Secondary | ICD-10-CM | POA: Diagnosis not present

## 2018-12-06 DIAGNOSIS — Z3491 Encounter for supervision of normal pregnancy, unspecified, first trimester: Secondary | ICD-10-CM

## 2018-12-06 LAB — POCT URINALYSIS DIPSTICK OB
Glucose, UA: NEGATIVE
POC,PROTEIN,UA: NEGATIVE

## 2018-12-06 NOTE — Patient Instructions (Signed)
Covid testing 12/07/2018 9-10 [facility]. Must wear a mask.  [address], [place], [place] [address]  IOL scheduled for 6/26/202 at 8am

## 2018-12-06 NOTE — Progress Notes (Signed)
    Routine Prenatal Care Visit  Subjective  Barbara Quinn is a 23 y.o. G4P0030 at [redacted]w[redacted]d being seen today for ongoing prenatal care.  She is currently monitored for the following issues for this low-risk pregnancy and has Supervision of low-risk pregnancy, first trimester and Pregnancy on their problem list.  ----------------------------------------------------------------------------------- Patient reports no complaints.   Contractions: Not present. Vag. Bleeding: None.  Movement: Present. Denies leaking of fluid.  ----------------------------------------------------------------------------------- The following portions of the patient's history were reviewed and updated as appropriate: allergies, current medications, past family history, past medical history, past social history, past surgical history and problem list. Problem list updated.   Objective  Blood pressure 130/70, weight 245 lb 6.4 oz (111.3 kg), last menstrual period 03/08/2018. Pregravid weight 197 lb (89.4 kg) Total Weight Gain 48 lb 6.4 oz (22 kg) Urinalysis:      Fetal Status: Fetal Heart Rate (bpm): 130 Fundal Height: 42 cm Movement: Present     General:  Alert, oriented and cooperative. Patient is in no acute distress.  Skin: Skin is warm and dry. No rash noted.   Cardiovascular: Normal heart rate noted  Respiratory: Normal respiratory effort, no problems with respiration noted  Abdomen: Soft, gravid, appropriate for gestational age. Pain/Pressure: Absent     Pelvic:  Cervical exam deferred        Extremities: Normal range of motion.  Edema: None  Mental Status: Normal mood and affect. Normal behavior. Normal judgment and thought content.     Assessment   23 y.o. G4P0030 at [redacted]w[redacted]d by  12/13/2018, by Last Menstrual Period presenting for routine prenatal visit  Plan   pregnancy4 Problems (from 04/07/18 to present)    Problem Noted Resolved   Supervision of low-risk pregnancy, first trimester 05/09/2018 by  Homero Fellers, MD No   Overview Addendum 11/04/2018  8:46 AM by Homero Fellers, MD      [facility] Prenatal Labs  Dating  LMP =8wk [place] Blood type: A/Positive/-- (11/11 1446)   Genetic Screen  NIPS: desires    Antibody:Negative (11/11 1446)  Anatomic [place] complete Rubella: 1.17 (11/11 1446)  Varicella: Nonimmune  GTT 28 wk:    93  RPR: Non Reactive (11/11 1446)   Rhogam Not needed HBsAg: Negative (11/11 1446)   TDaP vaccine   10/24/2018     HIV: Non Reactive (11/11 1446)   Flu Shot   Declines                             GBS:   Contraception  Pap: NIL 2019  CBB     CS/VBAC    Baby Food Breast/bottle   Support Person                 Gestational age appropriate obstetric precautions including but not limited to vaginal bleeding, contractions, leaking of fluid and fetal movement were reviewed in detail with the patient.    No pericardial effusion seen on [place] today. PACs noted on 11/10/2018 by MFM, no effusion noted then.  IOL scheduled for Friday. Discussed COVID-19 testing.   Return if symptoms worsen or fail to improve.  Homero Fellers MD [facility], [facility] 12/06/2018, 5:44 PM

## 2018-12-06 NOTE — Progress Notes (Signed)
ROB and U/S- no concerns 

## 2018-12-07 ENCOUNTER — Ambulatory Visit
Admission: RE | Admit: 2018-12-07 | Discharge: 2018-12-07 | Disposition: A | Discharge status: Home / Self Care | Payer: BC Managed Care – PPO | Source: Ambulatory Visit | Attending: Obstetrics and Gynecology | Admitting: Obstetrics and Gynecology

## 2018-12-07 ENCOUNTER — Other Ambulatory Visit: Payer: Self-pay

## 2018-12-07 DIAGNOSIS — Z1159 Encounter for screening for other viral diseases: Secondary | ICD-10-CM | POA: Diagnosis not present

## 2018-12-08 LAB — NOVEL CORONAVIRUS, NAA (HOSP ORDER, SEND-OUT TO REF LAB; TAT 18-24 HRS): SARS-CoV-2, NAA: NOT DETECTED

## 2018-12-09 ENCOUNTER — Ambulatory Visit: Admission: RE | Admit: 2018-12-09 | Payer: BC Managed Care – PPO | Source: Ambulatory Visit

## 2018-12-11 ENCOUNTER — Inpatient Hospital Stay
Admission: RE | Admit: 2018-12-11 | Discharge: 2018-12-14 | DRG: 807 | Disposition: A | Discharge status: Home / Self Care | Payer: BC Managed Care – PPO | Attending: Obstetrics and Gynecology | Admitting: Obstetrics and Gynecology

## 2018-12-11 ENCOUNTER — Other Ambulatory Visit: Payer: Self-pay

## 2018-12-11 DIAGNOSIS — Z3491 Encounter for supervision of normal pregnancy, unspecified, first trimester: Secondary | ICD-10-CM

## 2018-12-11 DIAGNOSIS — F17210 Nicotine dependence, cigarettes, uncomplicated: Secondary | ICD-10-CM | POA: Diagnosis present

## 2018-12-11 DIAGNOSIS — O9089 Other complications of the puerperium, not elsewhere classified: Secondary | ICD-10-CM | POA: Diagnosis not present

## 2018-12-11 DIAGNOSIS — Z3A39 39 weeks gestation of pregnancy: Secondary | ICD-10-CM

## 2018-12-11 DIAGNOSIS — O99824 Streptococcus B carrier state complicating childbirth: Secondary | ICD-10-CM | POA: Diagnosis present

## 2018-12-11 DIAGNOSIS — O99334 Smoking (tobacco) complicating childbirth: Secondary | ICD-10-CM | POA: Diagnosis present

## 2018-12-11 DIAGNOSIS — B951 Streptococcus, group B, as the cause of diseases classified elsewhere: Secondary | ICD-10-CM | POA: Diagnosis present

## 2018-12-11 DIAGNOSIS — R030 Elevated blood-pressure reading, without diagnosis of hypertension: Secondary | ICD-10-CM | POA: Diagnosis not present

## 2018-12-11 DIAGNOSIS — O26893 Other specified pregnancy related conditions, third trimester: Secondary | ICD-10-CM | POA: Diagnosis present

## 2018-12-11 DIAGNOSIS — O99214 Obesity complicating childbirth: Secondary | ICD-10-CM | POA: Diagnosis present

## 2018-12-11 DIAGNOSIS — Z3490 Encounter for supervision of normal pregnancy, unspecified, unspecified trimester: Secondary | ICD-10-CM | POA: Diagnosis present

## 2018-12-11 DIAGNOSIS — E669 Obesity, unspecified: Secondary | ICD-10-CM | POA: Diagnosis present

## 2018-12-11 DIAGNOSIS — O99820 Streptococcus B carrier state complicating pregnancy: Secondary | ICD-10-CM

## 2018-12-11 HISTORY — DX: Constipation, unspecified: K59.00

## 2018-12-11 LAB — CBC
HCT: 35.4 % — ABNORMAL LOW (ref 36.0–46.0)
Hemoglobin: 11.4 g/dL — ABNORMAL LOW (ref 12.0–15.0)
MCH: 26.8 pg (ref 26.0–34.0)
MCHC: 32.2 g/dL (ref 30.0–36.0)
MCV: 83.1 fL (ref 80.0–100.0)
Platelets: 410 10*3/uL — ABNORMAL HIGH (ref 150–400)
RBC: 4.26 MIL/uL (ref 3.87–5.11)
RDW: 14.2 % (ref 11.5–15.5)
WBC: 10.7 10*3/uL — ABNORMAL HIGH (ref 4.0–10.5)
nRBC: 0 % (ref 0.0–0.2)

## 2018-12-11 LAB — TYPE AND SCREEN
ABO/RH(D): A POS
Antibody Screen: NEGATIVE

## 2018-12-11 LAB — CHLAMYDIA/NGC RT PCR (ARMC ONLY)
Chlamydia Tr: NOT DETECTED
N gonorrhoeae: NOT DETECTED

## 2018-12-11 MED ORDER — LIDOCAINE HCL (PF) 1 % IJ SOLN: 30.0000 mL | INTRAMUSCULAR | Status: DC | PRN

## 2018-12-11 MED ORDER — LACTATED RINGERS IV SOLN
INTRAVENOUS | Status: DC
  Administered 2018-12-11 – 2018-12-12 (×6): via INTRAVENOUS

## 2018-12-11 MED ORDER — OXYTOCIN 40 UNITS IN NORMAL SALINE INFUSION - SIMPLE MED
INTRAVENOUS | Status: AC
  Administered 2018-12-11: 23:00:00 1 m[IU]/min via INTRAVENOUS
  Filled 2018-12-11: qty 1000

## 2018-12-11 MED ORDER — OXYTOCIN BOLUS FROM INFUSION
500.0000 mL | Freq: Once | INTRAVENOUS | Status: AC
  Administered 2018-12-12: 500 mL via INTRAVENOUS

## 2018-12-11 MED ORDER — ONDANSETRON HCL 4 MG/2ML IJ SOLN: 4.0000 mg | Freq: Four times a day (QID) | INTRAMUSCULAR | Status: DC | PRN

## 2018-12-11 MED ORDER — OXYTOCIN 10 UNIT/ML IJ SOLN
INTRAMUSCULAR | Status: AC
  Filled 2018-12-11: qty 2

## 2018-12-11 MED ORDER — ACETAMINOPHEN 325 MG PO TABS: 650.0000 mg | ORAL_TABLET | ORAL | Status: DC | PRN

## 2018-12-11 MED ORDER — LACTATED RINGERS IV SOLN
500.0000 mL | INTRAVENOUS | Status: DC | PRN
  Administered 2018-12-12 (×2): 500 mL via INTRAVENOUS
  Administered 2018-12-12: 250 mL via INTRAVENOUS

## 2018-12-11 MED ORDER — OXYTOCIN 40 UNITS IN NORMAL SALINE INFUSION - SIMPLE MED
1.0000 m[IU]/min | INTRAVENOUS | Status: DC
  Administered 2018-12-11 – 2018-12-12 (×2): 1 m[IU]/min via INTRAVENOUS

## 2018-12-11 MED ORDER — LIDOCAINE HCL (PF) 1 % IJ SOLN
INTRAMUSCULAR | Status: AC
  Filled 2018-12-11: qty 30

## 2018-12-11 MED ORDER — BUTORPHANOL TARTRATE 1 MG/ML IJ SOLN
1.0000 mg | INTRAMUSCULAR | Status: DC | PRN
  Administered 2018-12-12: 1 mg via INTRAVENOUS
  Filled 2018-12-11 (×3): qty 1

## 2018-12-11 MED ORDER — MISOPROSTOL 200 MCG PO TABS
ORAL_TABLET | ORAL | Status: AC
  Filled 2018-12-11: qty 4

## 2018-12-11 MED ORDER — NICOTINE 21 MG/24HR TD PT24
21.0000 mg | MEDICATED_PATCH | Freq: Every day | TRANSDERMAL | Status: DC
  Administered 2018-12-11: 19:00:00 21 mg via TRANSDERMAL
  Filled 2018-12-11: qty 1

## 2018-12-11 MED ORDER — AMMONIA AROMATIC IN INHA
RESPIRATORY_TRACT | Status: AC
  Filled 2018-12-11: qty 10

## 2018-12-11 MED ORDER — MISOPROSTOL 100 MCG PO TABS
25.0000 ug | ORAL_TABLET | ORAL | Status: DC | PRN
  Administered 2018-12-11 (×3): 25 ug via VAGINAL
  Filled 2018-12-11 (×4): qty 1

## 2018-12-11 MED ORDER — SOD CITRATE-CITRIC ACID 500-334 MG/5ML PO SOLN: 30.0000 mL | ORAL | Status: DC | PRN

## 2018-12-11 MED ORDER — TERBUTALINE SULFATE 1 MG/ML IJ SOLN: 0.2500 mg | Freq: Once | INTRAMUSCULAR | Status: DC | PRN

## 2018-12-11 MED ORDER — OXYTOCIN 40 UNITS IN NORMAL SALINE INFUSION - SIMPLE MED: 2.5000 [IU]/h | INTRAVENOUS | Status: DC

## 2018-12-11 MED ORDER — SODIUM CHLORIDE 0.9 % IV SOLN
5.0000 10*6.[IU] | Freq: Once | INTRAVENOUS | Status: AC
  Administered 2018-12-11: 10:00:00 5 10*6.[IU] via INTRAVENOUS
  Filled 2018-12-11: qty 5

## 2018-12-11 MED ORDER — PENICILLIN G 3 MILLION UNITS IVPB - SIMPLE MED
3.0000 10*6.[IU] | INTRAVENOUS | Status: DC
  Administered 2018-12-11 – 2018-12-12 (×7): 3 10*6.[IU] via INTRAVENOUS
  Filled 2018-12-11 (×11): qty 100

## 2018-12-11 NOTE — Progress Notes (Signed)
Obstetric H&P   Chief Complaint: Elective IOL  Prenatal Care Provider: [facility]  History of Present Illness: 23 y.o. G4P0030 1w5dby 12/13/2018, by Last Menstrual Period = 8 week [place] to [place] for elective IOL.  +FM, no LOF, no VB, no ctx.  Pregnancy complicated by obesity Body mass index is 36.95 kg/m., GBS positive carrier status, EFW 8lbs 9oz c/w 85%ile on 12/06/2018  Pregravid weight 89.4 kg Total Weight Gain 20.9 kg  pregnancy4 Problems (from 04/07/18 to present)    Problem Noted Resolved   Supervision of low-risk pregnancy, first trimester 05/09/2018 by SHomero Fellers MD No   Overview Addendum 11/04/2018  8:46 AM by SHomero Fellers MD      [facility] Prenatal Labs  Dating  LMP =8wk [place] type: A/Positive/-- (11/11 1446)   Genetic Screen  NIPS: Normal XY  Antibody:Negative (11/11 1446)  Anatomic [place]plete Rubella: 1.17 (11/11 1446)  Varicella: Nonimmune  GTT 28 wk:    93  RPR: Non Reactive (11/11 1446)   Rhogam Not needed HBsAg: Negative (11/11 1446)   TDaP vaccine   10/24/2018     HIV: Non Reactive (11/11 1446)   Flu Shot   Declines                             GBS: Positive  Contraception  Pap: NIL 2019  CBB     CS/VBAC    Baby Food Breast/bottle   Support Person                 Review of Systems: 10 point review of systems negative unless otherwise noted in HPI  Past Medical History: Past Medical History:  Diagnosis Date  . Constipation   . Kidney stone   . Seizures ([place]    last seizure at age 23   Past Surgical History: Past Surgical History:  Procedure Laterality Date  . LITHOTRIPSY  2011  . tubes in ears      Past Obstetric History: # 1 - Date: 2015, Sex: None, Weight: None, GA: None, Delivery: None, Apgar1: None, Apgar5: None, Living: None, Birth Comments: None  # 2 - Date: 2017, Sex: None, Weight: None, GA: None, Delivery: None, Apgar1: None, Apgar5: None, Living: None, Birth Comments: None  # 3 - Date: 2019,  Sex: None, Weight: None, GA: None, Delivery: None, Apgar1: None, Apgar5: None, Living: None, Birth Comments: None  # 4 - Date: None, Sex: None, Weight: None, GA: None, Delivery: None, Apgar1: None, Apgar5: None, Living: None, Birth Comments: None   Past Gynecologic History:  Family History: Family History  Problem Relation Age of Onset  . Depression Mother   . Heart attack Mother   . GER disease Mother   . GER disease Father     Social History: Social History   Socioeconomic History  . Marital status: Single    Spouse name: Not on file  . Number of children: Not on file  . Years of education: Not on file  . Highest education level: Not on file  Occupational History  . Not on file  Social Needs  . Financial resource strain: Not on file  . Food insecurity    Worry: Not on file    Inability: Not on file  . Transportation needs    Medical: Not on file    Non-medical: Not on file  Tobacco Use  . Smoking status: Current Every Day Smoker  Packs/day: 0.50  . Smokeless tobacco: Never Used  Substance and Sexual Activity  . Alcohol use: Not Currently  . Drug use: No  . Sexual activity: Yes    Birth control/protection: Other-see comments    Comment: Still deciding  Lifestyle  . Physical activity    Days per week: Not on file    Minutes per session: Not on file  . Stress: Not on file  Relationships  . Social Herbalist on phone: Not on file    Gets together: Not on file    Attends religious service: Not on file    Active member of club or organization: Not on file    Attends meetings of clubs or organizations: Not on file    Relationship status: Not on file  . Intimate partner violence    Fear of current or ex partner: Not on file    Emotionally abused: Not on file    Physically abused: Not on file    Forced sexual activity: Not on file  Other Topics Concern  . Not on file  Social History Narrative  . Not on file    Medications: Prior to Admission  medications   Medication Sig Start Date End Date Taking? Authorizing Provider  acetaminophen (TYLENOL) 500 MG tablet Take 500 mg by mouth every 6 (six) hours as needed.   Yes [provider]  sucralfate (CARAFATE) 1 g tablet Take 1 tablet (1 g total) by mouth 4 (four) times daily -  with meals and at bedtime. 11/04/18  Yes Schuman, Stefanie Libel, MD  clotrimazole-betamethasone (LOTRISONE) cream Apply 1 application topically 2 (two) times daily. Patient not taking: Reported on 11/28/2018 10/24/18   Gae Dry, MD    Allergies: No Known Allergies  Physical Exam: Vitals: Blood pressure 135/86, pulse 98, temperature 98.2 F (36.8 C), temperature source Oral, resp. rate 16, height 5' 8" (1.727 m), weight 110.2 kg, last menstrual period 03/08/2018.  FHT: 149, moderate, +accels, questionable decel at 0900 Toco: none  General: NAD HEENT: normocephalic, anicteric Pulmonary: No increased work of breathing Cardiovascular: RRR, distal pulses 2+ Abdomen: Gravid, non-tender [place]: Genitourinary:    Extremities: no edema, erythema, or tenderness Neurologic: Grossly intact Psychiatric: mood appropriate, affect full  Labs: No results found for this or any previous visit (from the past 24 hour(s)).  Assessment: 23 y.o. G4P0030 16w5dby 12/13/2018, by Last Menstrual Period elective IOL  Plan: 1) The ARRIVE study was a national multicenter trial that randomized 6,106 patients to induction of labor at 39 weeks 0 days to 39 weeks 4 days (3,062) compared to expectant management (3,044).  There was no significant difference in adverse perinatal outcomes but there was a significantly lower rate of cesarean delivery, as well as lower rate of maternal hypertensive complications in the induction group.  Number to treat was calculated as 28 induction of labor to prevent 1 primary Cesarean section.  "Labor Induction versus Expectant Management in Nulliparous Low-Risk Women" [employer] [place] [employer] iAugust 9, 2018 Vol. 379 No. 6   2) Fetus - cat I  3) PNL - Blood type A/Positive/-- (11/11 1446) / Anti-bodyscreen Negative (11/11 1446) / Rubella 1.17 (11/11 1446) / Varicella Non-Immune / RPR Non Reactive (04/13 1523) / HBsAg Negative (11/11 1446) / HIV Non Reactive (04/13 1523) / 1-hr OGTT 93 / GBS Positive (06/05 1641)  - Varivax postpartum - PCN GBS ppx  4) Immunization History -  Immunization History  Administered Date(s) Administered  .  DTP 04/03/1996, 06/19/1996, 08/16/1996, 02/01/1997  . DTaP 04/03/1996, 06/19/1996, 08/16/1996, 02/01/1997, 03/04/2000  . HPV Quadrivalent 02/02/2007, 04/03/2008, 02/20/2009  . Hepatitis A, Adult 02/02/2007, 04/03/2008  . Hepatitis B, adult 09[birth year]06[birth year]95, 04/03/1996, 08/16/1996  . IPV 04/03/1996, 06/19/1996, 02/01/1997, 03/04/2000  . Influenza Nasal 05/06/2010  . Influenza, Seasonal, Injecte, Preservative Fre 02/20/2009  . MMR 02/01/1997, 03/04/2000  . Meningococcal Conjugate 02/02/2007, 07/18/2013  . OPV 04/03/1996, 06/19/1996, 02/01/1997  . Tdap 02/02/2007, 10/24/2018  . Varicella 03/07/1997, 04/03/2008    5) Disposition - pending delivery  Malachy Mood, MD, [place] 12/11/2018, 9:23 AM

## 2018-12-11 NOTE — Progress Notes (Signed)
   Subjective:  Increasing strength of contractions  Objective:   Vitals: Blood pressure 132/75, pulse 74, temperature 98.1 F (36.7 C), temperature source Oral, resp. rate 18, height 5\' 8"  (1.727 m), weight 110.2 kg, last menstrual period 03/08/2018. General: NAD Abdomen: gravid, non-tender Cervical Exam:  Dilation: 3 Effacement (%): 50 Cervical Position: Posterior Station: -3 Presentation: Vertex Exam by:: A.Zylan Almquist, MD  FHT: 140, moderate, +accels, no decels Toco: q2-1min  Results for orders placed or performed during the hospital encounter of 12/11/18 (from the past 24 hour(s))  CBC     Status: Abnormal   Collection Time: 12/11/18  8:51 AM  Result Value Ref Range   WBC 10.7 (H) 4.0 - 10.5 K/uL   RBC 4.26 3.87 - 5.11 MIL/uL   Hemoglobin 11.4 (L) 12.0 - 15.0 g/dL   HCT 35.4 (L) 36.0 - 46.0 %   MCV 83.1 80.0 - 100.0 fL   MCH 26.8 26.0 - 34.0 pg   MCHC 32.2 30.0 - 36.0 g/dL   RDW 14.2 11.5 - 15.5 %   Platelets 410 (H) 150 - 400 K/uL   nRBC 0.0 0.0 - 0.2 %  Type and screen [place]     Status: None   Collection Time: 12/11/18  8:51 AM  Result Value Ref Range   ABO/RH(D) A POS    Antibody Screen NEG    Sample Expiration      12/14/2018,2359 Performed at [facility], [address] [place], [place] [address]   [place] rt PCR [facility] only)     Status: None   Collection Time: 12/11/18  7:07 PM   Specimen: Urine  Result Value Ref Range   Specimen source GC/Chlam URINE, RANDOM    Chlamydia Tr NOT DETECTED NOT DETECTED   N gonorrhoeae NOT DETECTED NOT DETECTED    Assessment:   23 y.o. G4P0030 [redacted]w[redacted]d IOL elective  Plan:   1) Labor - switch from Cytotec to pitocin  2) Fetus - cat I tracing   Malachy Mood, MD, [facility] 12/11/2018, 10:44 PM

## 2018-12-12 ENCOUNTER — Inpatient Hospital Stay: Payer: BC Managed Care – PPO | Admitting: Anesthesiology

## 2018-12-12 MED ORDER — OXYCODONE HCL 5 MG PO TABS: 10.0000 mg | ORAL_TABLET | ORAL | Status: DC | PRN

## 2018-12-12 MED ORDER — ONDANSETRON HCL 4 MG/2ML IJ SOLN: 4.0000 mg | INTRAMUSCULAR | Status: DC | PRN

## 2018-12-12 MED ORDER — ACETAMINOPHEN 325 MG PO TABS
650.0000 mg | ORAL_TABLET | ORAL | Status: DC | PRN
  Administered 2018-12-13 – 2018-12-14 (×2): 650 mg via ORAL
  Filled 2018-12-12 (×3): qty 2

## 2018-12-12 MED ORDER — SIMETHICONE 80 MG PO CHEW: 80.0000 mg | CHEWABLE_TABLET | ORAL | Status: DC | PRN

## 2018-12-12 MED ORDER — DIBUCAINE (PERIANAL) 1 % EX OINT: 1.0000 "application " | TOPICAL_OINTMENT | CUTANEOUS | Status: DC | PRN

## 2018-12-12 MED ORDER — COCONUT OIL OIL: 1.0000 "application " | TOPICAL_OIL | Status: DC | PRN

## 2018-12-12 MED ORDER — LIDOCAINE-EPINEPHRINE (PF) 1.5 %-1:200000 IJ SOLN
INTRAMUSCULAR | Status: DC | PRN
  Administered 2018-12-12: 3 mL via PERINEURAL

## 2018-12-12 MED ORDER — LIDOCAINE HCL (PF) 1 % IJ SOLN
INTRAMUSCULAR | Status: DC | PRN
  Administered 2018-12-12: 3 mL

## 2018-12-12 MED ORDER — FENTANYL 2.5 MCG/ML W/ROPIVACAINE 0.15% IN NS 100 ML EPIDURAL (ARMC)
EPIDURAL | Status: DC | PRN
  Administered 2018-12-12: 250 ug via EPIDURAL
  Administered 2018-12-12: 12 mL/h via EPIDURAL

## 2018-12-12 MED ORDER — SENNOSIDES-DOCUSATE SODIUM 8.6-50 MG PO TABS
2.0000 | ORAL_TABLET | ORAL | Status: DC
  Filled 2018-12-12: qty 2

## 2018-12-12 MED ORDER — FENTANYL 2.5 MCG/ML W/ROPIVACAINE 0.15% IN NS 100 ML EPIDURAL (ARMC)
EPIDURAL | Status: AC
  Filled 2018-12-12: qty 100

## 2018-12-12 MED ORDER — BUPIVACAINE HCL (PF) 0.25 % IJ SOLN
INTRAMUSCULAR | Status: DC | PRN
  Administered 2018-12-12 (×2): 5 mL via EPIDURAL

## 2018-12-12 MED ORDER — DIPHENHYDRAMINE HCL 25 MG PO CAPS: 25.0000 mg | ORAL_CAPSULE | Freq: Four times a day (QID) | ORAL | Status: DC | PRN

## 2018-12-12 MED ORDER — PRENATAL MULTIVITAMIN CH
1.0000 | ORAL_TABLET | Freq: Every day | ORAL | Status: DC
  Administered 2018-12-14: 1 via ORAL
  Filled 2018-12-12 (×2): qty 1

## 2018-12-12 MED ORDER — TETANUS-DIPHTH-ACELL PERTUSSIS 5-2.5-18.5 LF-MCG/0.5 IM SUSP: 0.5000 mL | Freq: Once | INTRAMUSCULAR | Status: DC

## 2018-12-12 MED ORDER — WITCH HAZEL-GLYCERIN EX PADS: 1.0000 "application " | MEDICATED_PAD | CUTANEOUS | Status: DC | PRN

## 2018-12-12 MED ORDER — IBUPROFEN 600 MG PO TABS
600.0000 mg | ORAL_TABLET | Freq: Four times a day (QID) | ORAL | Status: DC
  Administered 2018-12-13 – 2018-12-14 (×4): 600 mg via ORAL
  Filled 2018-12-12 (×4): qty 1

## 2018-12-12 MED ORDER — OXYCODONE HCL 5 MG PO TABS: 5.0000 mg | ORAL_TABLET | ORAL | Status: DC | PRN

## 2018-12-12 MED ORDER — ONDANSETRON HCL 4 MG PO TABS: 4.0000 mg | ORAL_TABLET | ORAL | Status: DC | PRN

## 2018-12-12 MED ORDER — BENZOCAINE-MENTHOL 20-0.5 % EX AERO
1.0000 "application " | INHALATION_SPRAY | CUTANEOUS | Status: DC | PRN
  Administered 2018-12-13: 1 via TOPICAL
  Filled 2018-12-12: qty 56

## 2018-12-12 MED FILL — Sodium Chloride IV Soln 0.9%: INTRAVENOUS | Qty: 1000 | Status: AC

## 2018-12-12 MED FILL — Oxytocin Inj 10 Unit/ML: INTRAMUSCULAR | Qty: 4 | Status: AC

## 2018-12-12 NOTE — Progress Notes (Signed)
  Labor Progress Note   23 y.o. [id number] @ [redacted]w[redacted]d , admitted for  Pregnancy, Labor Management. Elective iol  Subjective:  Comfortable with epidural  Objective:  BP 128/80   Pulse 69   Temp 97.8 F (36.6 C) (Oral)   Resp 18   Ht 5\' 8"  (1.727 m)   Wt 110.2 kg   LMP 03/08/2018   SpO2 99%   BMI 36.95 kg/m  Abd: gravid, ND, FHT present, mild tenderness on exam Extr: trace to 1+ bilateral pedal edema SVE: CERVIX: 7.5 cm dilated, 90 effaced, -1 station  EFM: FHR: 120 bpm, variability: moderate,  accelerations:  Present,  decelerations:  Present variables with quick return to baseline Toco: Frequency: Every 3-4 minutes Labs: I have reviewed the patient's lab results.   Assessment & Plan:  [id number] @ [redacted]w[redacted]d, admitted for  Pregnancy and Labor/Delivery Management  1. Pain management: epidural. 2. FWB: FHT category II and overall reassuring.  3. ID: GBS positive: penicillin prophylaxis 4. Labor management: pitocin  All discussed with patient, see orders   Rod Can, [facility] 12/12/2018  2:22 PM

## 2018-12-12 NOTE — Progress Notes (Signed)
   Subjective:  Increasing intensity to contractions  Objective:   Vitals: Blood pressure 136/71, pulse 67, temperature 97.8 F (36.6 C), temperature source Oral, resp. rate 18, height 5\' 8"  (1.727 m), weight 110.2 kg, last menstrual period 03/08/2018, SpO2 97 %. General: NAD Abdomen:Gravid, non-tender Cervical Exam:  Dilation: 3 Effacement (%): 70 Cervical Position: Posterior Station: -1, -2 Presentation: Vertex Exam by:: Krystiana Fornes MD  FHT: 130, moderate, +accels, no decels Toco: q1-60min  Results for orders placed or performed during the hospital encounter of 12/11/18 (from the past 24 hour(s))  CBC     Status: Abnormal   Collection Time: 12/11/18  8:51 AM  Result Value Ref Range   WBC 10.7 (H) 4.0 - 10.5 K/uL   RBC 4.26 3.87 - 5.11 MIL/uL   Hemoglobin 11.4 (L) 12.0 - 15.0 g/dL   HCT 35.4 (L) 36.0 - 46.0 %   MCV 83.1 80.0 - 100.0 fL   MCH 26.8 26.0 - 34.0 pg   MCHC 32.2 30.0 - 36.0 g/dL   RDW 14.2 11.5 - 15.5 %   Platelets 410 (H) 150 - 400 K/uL   nRBC 0.0 0.0 - 0.2 %  Type and screen [place]     Status: None   Collection Time: 12/11/18  8:51 AM  Result Value Ref Range   ABO/RH(D) A POS    Antibody Screen NEG    Sample Expiration      12/14/2018,2359 Performed at [facility], [address] [place], [place] [address]   [place] rt PCR [facility] only)     Status: None   Collection Time: 12/11/18  7:07 PM   Specimen: Urine  Result Value Ref Range   Specimen source GC/Chlam URINE, RANDOM    Chlamydia Tr NOT DETECTED NOT DETECTED   N gonorrhoeae NOT DETECTED NOT DETECTED    Assessment:   23 y.o. G4P0030 [redacted]w[redacted]d elective IOL  Plan:   1) Labor - continue pitocin, plans on epidural, consider AROM after epidural placement  2) Fetus - cat I tracing  3) GBS positive - continue PCN ppx  Malachy Mood, MD, [place], [facility] 12/12/2018, 7:45 AM

## 2018-12-12 NOTE — Anesthesia Preprocedure Evaluation (Signed)
Anesthesia Evaluation  Patient identified by MRN, date of birth, ID band Patient awake    Reviewed: Allergy & Precautions, H&P , NPO status , Patient's Chart, lab work & pertinent test results  History of Anesthesia Complications Negative for: history of anesthetic complications  Airway Mallampati: II  TM Distance: >3 FB Neck ROM: full    Dental no notable dental hx.    Pulmonary neg pulmonary ROS, Current Smoker,    Pulmonary exam normal        Cardiovascular negative cardio ROS Normal cardiovascular exam     Neuro/Psych negative neurological ROS  negative psych ROS   GI/Hepatic negative GI ROS, Neg liver ROS,   Endo/Other  negative endocrine ROS  Renal/GU negative Renal ROS  negative genitourinary   Musculoskeletal   Abdominal   Peds  Hematology negative hematology ROS (+)   Anesthesia Other Findings   Reproductive/Obstetrics (+) Pregnancy                             Anesthesia Physical Anesthesia Plan  ASA: II  Anesthesia Plan: Epidural   Post-op Pain Management:    Induction:   PONV Risk Score and Plan:   Airway Management Planned:   Additional Equipment:   Intra-op Plan:   Post-operative Plan:   Informed Consent: I have reviewed the patients History and Physical, chart, labs and discussed the procedure including the risks, benefits and alternatives for the proposed anesthesia with the patient or authorized representative who has indicated his/her understanding and acceptance.       Plan Discussed with: CRNA and Anesthesiologist  Anesthesia Plan Comments:         Anesthesia Quick Evaluation

## 2018-12-12 NOTE — Progress Notes (Signed)
  Labor Progress Note   23 y.o. G4W1027 @ [redacted]w[redacted]d , admitted for  Pregnancy, Labor Management. Elective iol  Subjective:  Having pain on left side and comfortable on right side. Will call anesthesia to assess.   Objective:  BP (!) 147/94   Pulse 78   Temp 97.7 F (36.5 C) (Axillary)   Resp 16   Ht 5\' 8"  (1.727 m)   Wt 110.2 kg   LMP 03/08/2018   SpO2 100%   BMI 36.95 kg/m  Abd: gravid, ND, FHT present, mild tenderness on exam Extr: trace to 1+ bilateral pedal edema SVE: CERVIX: 10 cm dilated, 100 effaced, +1 +2 station  EFM: FHR: 120 bpm, variability: moderate,  accelerations:  Present,  decelerations:  Present, late decelerations to 70s/80s with pushing. Improvement in tracing following turning pitocin down to 3 from 11, fluid bolus and position changes.  Toco: Frequency: Every 2-4 minutes Labs: I have reviewed the patient's lab results.   Assessment & Plan:  [id number] @ [redacted]w[redacted]d, admitted for  Pregnancy and Labor/Delivery Management  1. Pain management: epidural. Anesthesia called to assess one sided epidural 2. FWB: FHT category II/category I after pushing stopped.  3. ID: GBS positive: penicillin prophylaxis 4. Labor management: position change, pitocin turned down, fluid bolus, labor down, discussed plan of care with Dr Glennon Mac. He will come to hospital to evaluate.  All discussed with patient, see orders   Rod Can, [facility] 12/12/2018  7:04 PM

## 2018-12-12 NOTE — Discharge Summary (Signed)
OB Discharge Summary     Patient Name: Barbara Quinn DOB: March 21, 1996 MRN: [id number]  Date of admission: 12/11/2018 Delivering provider: Tresea MallJane Gledhill, CNM  Date of Delivery: 12/12/2018  Date of discharge: 12/14/2018  Admitting diagnosis: Labor Intrauterine pregnancy: 7411w6d     Secondary diagnosis: None     Discharge diagnosis: Term Pregnancy Delivered                                                                                                Post partum procedures:Varivax  Augmentation: Pitocin and Cytotec  Complications: None  Hospital course:  Induction of Labor With Vaginal Delivery   23 y.o. yo G4P0030 at 3411w6d was admitted to the hospital 12/11/2018 for induction of labor.  Indication for induction: elective.  Patient had an uncomplicated labor course as follows: Membrane Rupture Time/Date: 7:30 AM ,12/12/2018   Patient had delivery of viable female: 9:09 PM, 12/12/2018  Details of delivery can be found in separate delivery note.    Patient had a postpartum course complicated for mild range blood pressures during and following labor. PIH labs were normal. Possible gestational hypertension vs chronic hypertension. By PPD #2, she had 12 hours of normal blood pressures. She was tolerating a regular diet, voiding without difficulty and caring for baby without difficulty.  Patient is discharged home 12/14/18. She was advised to have blood pressure check on j6 July and report any symptoms of preeclampsia  Physical exam  Vitals:   12/13/18 1939 12/13/18 2346 12/14/18 0346 12/14/18 0817  BP: (!) 135/92 130/71 124/76 132/78  Pulse: 88 97 77 63  Resp: 20 18 18 18   Temp: 97.7 F (36.5 C) 98.4 F (36.9 C) 97.9 F (36.6 C) 98.5 F (36.9 C)  TempSrc: Oral Oral Oral Oral  SpO2: 100% 100% 100% 99%  Weight:      Height:       General: alert, cooperative and no distress Lochia: appropriate Uterine Fundus: firm/ U-1/ML/NT Lacerations: C&D&I DVT Evaluation: No evidence of DVT  seen on physical exam.  Labs: Lab Results  Component Value Date   WBC 12.1 (H) 12/13/2018   HGB 9.5 (L) 12/13/2018   HCT 29.3 (L) 12/13/2018   MCV 82.1 12/13/2018   PLT 319 12/13/2018    Discharge instruction: per After Visit Summary.  Medications:  Allergies as of 12/14/2018   No Known Allergies     Medication List    TAKE these medications   acetaminophen 500 MG tablet Commonly known as: TYLENOL Take 2 tablets (1,000 mg total) by mouth every 6 (six) hours as needed for mild pain or moderate pain. What changed:   how much to take  reasons to take this   Concept DHA 53.5-38-1 MG Caps Take 1 capsule by mouth daily.   sucralfate 1 g tablet Commonly known as: Carafate Take 1 tablet (1 g total) by mouth 4 (four) times daily -  with meals and at bedtime.       Diet: routine diet  Activity: Advance as tolerated. Pelvic rest for 6 weeks.   Outpatient follow up: Follow-up Information    Tresea MallGledhill, Jane,  CNM. Go in 2 week(s).   Specialty: Obstetrics Why: postpartum mood check Contact information: [address] [place] [address] [phone]             Postpartum contraception: Undecided Rhogam Given postpartum: NA Rubella vaccine given postpartum: Rubella Immune Varicella vaccine given postpartum: yes TDaP given antepartum or postpartum: given antepartum  Newborn Data: Live born female: Albesa Seen Birth Weight:  3680 g APGAR: 8, 10  Newborn Delivery   Birth date/time: 12/12/2018 21:09:00 Delivery type: Vaginal, Spontaneous       Baby Feeding: Breastfeeding and bottle feeding  Disposition:home with mother  SIGNED:  Dalia Heading, CNM 12/14/2018 12:22 PM

## 2018-12-12 NOTE — Anesthesia Procedure Notes (Signed)
Epidural Patient location during procedure: OB Start time: 12/12/2018 8:09 AM End time: 12/12/2018 8:28 AM  Staffing Resident/CRNA: Nelda Marseille, CRNA Performed: resident/CRNA   Preanesthetic Checklist Completed: patient identified, site marked, surgical consent, pre-op evaluation, timeout performed, IV checked, risks and benefits discussed and monitors and equipment checked  Epidural Patient position: sitting Prep: Betadine Patient monitoring: heart rate, continuous pulse ox and blood pressure Approach: midline Location: L3-L4 Injection technique: LOR air  Needle:  Needle type: Tuohy  Needle gauge: 17 G Needle length: 9 cm and 9 Catheter type: closed end flexible Catheter size: 20 Guage Test dose: negative and 1.5% lidocaine with Epi 1:200 K  Assessment Sensory level: T10 Events: blood not aspirated, injection not painful, no injection resistance, negative IV test and no paresthesia  Additional Notes   Patient tolerated the insertion well without complications.Reason for block:procedure for pain

## 2018-12-13 LAB — COMPREHENSIVE METABOLIC PANEL
ALT: 10 U/L (ref 0–44)
AST: 19 U/L (ref 15–41)
Albumin: 2.1 g/dL — ABNORMAL LOW (ref 3.5–5.0)
Alkaline Phosphatase: 129 U/L — ABNORMAL HIGH (ref 38–126)
Anion gap: 8 (ref 5–15)
BUN: 7 mg/dL (ref 6–20)
CO2: 17 mmol/L — ABNORMAL LOW (ref 22–32)
Calcium: 7.9 mg/dL — ABNORMAL LOW (ref 8.9–10.3)
Chloride: 112 mmol/L — ABNORMAL HIGH (ref 98–111)
Creatinine, Ser: 0.59 mg/dL (ref 0.44–1.00)
GFR calc Af Amer: >60 mL/min (ref >60)
GFR calc non Af Amer: >60 mL/min (ref >60)
Glucose, Bld: 110 mg/dL — ABNORMAL HIGH (ref 70–99)
Potassium: 3.4 mmol/L — ABNORMAL LOW (ref 3.5–5.1)
Sodium: 137 mmol/L (ref 135–145)
Total Bilirubin: 0.6 mg/dL (ref 0.3–1.2)
Total Protein: 4.8 g/dL — ABNORMAL LOW (ref 6.5–8.1)

## 2018-12-13 LAB — CBC
HCT: 29.3 % — ABNORMAL LOW (ref 36.0–46.0)
Hemoglobin: 9.5 g/dL — ABNORMAL LOW (ref 12.0–15.0)
MCH: 26.6 pg (ref 26.0–34.0)
MCHC: 32.4 g/dL (ref 30.0–36.0)
MCV: 82.1 fL (ref 80.0–100.0)
Platelets: 319 10*3/uL (ref 150–400)
RBC: 3.57 MIL/uL — ABNORMAL LOW (ref 3.87–5.11)
RDW: 14.4 % (ref 11.5–15.5)
WBC: 12.1 10*3/uL — ABNORMAL HIGH (ref 4.0–10.5)
nRBC: 0 % (ref 0.0–0.2)

## 2018-12-13 LAB — RPR: RPR Ser Ql: NONREACTIVE

## 2018-12-13 LAB — PROTEIN / CREATININE RATIO, URINE
Creatinine, Urine: 124 mg/dL
Protein Creatinine Ratio: 0.17 mg/mg{Cre} — ABNORMAL HIGH (ref 0.00–0.15)
Total Protein, Urine: 21 mg/dL

## 2018-12-13 MED ORDER — FERROUS SULFATE 325 (65 FE) MG PO TABS
325.0000 mg | ORAL_TABLET | Freq: Every day | ORAL | Status: DC
  Administered 2018-12-13 – 2018-12-14 (×2): 325 mg via ORAL
  Filled 2018-12-13 (×2): qty 1

## 2018-12-13 MED ORDER — OXYTOCIN 40 UNITS IN NORMAL SALINE INFUSION - SIMPLE MED
INTRAVENOUS | Status: AC
  Filled 2018-12-13: qty 1000

## 2018-12-13 NOTE — Anesthesia Postprocedure Evaluation (Signed)
Anesthesia Post Note  Patient: Barbara Quinn  Procedure(s) Performed: AN AD [place]  Patient location during evaluation: Mother Baby Anesthesia Type: Epidural Level of consciousness: awake and alert and oriented Pain management: pain level controlled Vital Signs Assessment: post-procedure vital signs reviewed and stable Respiratory status: spontaneous breathing, nonlabored ventilation and respiratory function stable Cardiovascular status: stable Postop Assessment: no headache, no backache and epidural receding Anesthetic complications: no     Last Vitals:  Vitals:   12/13/18 0112 12/13/18 0322  BP: 135/79 (!) 149/80  Pulse: (!) 57 68  Resp: 18   Temp: 36.7 C 36.8 C  SpO2: 97% 100%    Last Pain:  Vitals:   12/13/18 0322  TempSrc: Oral  PainSc:                  [name]

## 2018-12-13 NOTE — Progress Notes (Signed)
Post Partum Day 1 Subjective: no complaints, up ad lib and voiding Tolerating regular diet. Bottle feeding Denies headache, lightheadedness. Has had some blood pressure elevations prior to pregnancy.  Objective: Blood pressure 132/83, pulse 71, temperature 98.6 F (37 C), temperature source Oral, resp. rate 18, height 5\' 8"  (1.727 m), weight 110.2 kg, last menstrual period 03/08/2018, SpO2 100 %, unknown if currently breastfeeding. Patient Vitals for the past 24 hrs:  BP Temp Temp src Pulse Resp SpO2  12/13/18 0830 132/83 98.6 F (37 C) Oral 71 18 100 %  12/13/18 0322 (!) 149/80 98.3 F (36.8 C) Oral 68 - 100 %  12/13/18 0112 135/79 98 F (36.7 C) Oral (!) 57 18 97 %  12/13/18 0004 (!) 141/79 99 F (37.2 C) Oral 80 16 100 %  12/12/18 2330 (!) 141/97 - - - - -  12/12/18 2259 (!) 150/94 - - 92 - -  12/12/18 2244 (!) 146/88 - - 89 - -  12/12/18 2230 (!) 155/86 - - 95 - -  12/12/18 2214 (!) 146/91 - - 93 - -  12/12/18 2159 (!) 144/82 - - 92 - -  12/12/18 2144 (!) 152/83 - - 82 - -  12/12/18 2129 (!) 152/85 - - 94 - -  12/12/18 2114 (!) 154/76 - - 96 - -  12/12/18 2014 129/83 - - 82 - -  12/12/18 1915 - - - - - 98 %  12/12/18 1914 126/72 98.3 F (36.8 C) Oral 73 18 -  12/12/18 1845 - - - - - 100 %  12/12/18 1840 - - - - - 98 %  12/12/18 1735 - - - - - 100 %  12/12/18 1733 - - - - - 98 %  12/12/18 1725 - - - - - 100 %  12/12/18 1720 - - - - - 100 %  12/12/18 1715 - - - - - 100 %  12/12/18 1711 (!) 147/94 - - 78 - 100 %  12/12/18 1705 - - - - - 100 %  12/12/18 1700 - - - - - 100 %  12/12/18 1655 - - - - - 100 %  12/12/18 1650 - - - - - 100 %  12/12/18 1645 - - - - - 100 %  12/12/18 1640 - - - - - 100 %  12/12/18 1635 - - - - - 100 %  12/12/18 1630 - - - - - 100 %  12/12/18 1625 - - - - - 99 %  12/12/18 1620 - - - - - 99 %  12/12/18 1615 - - - - - 98 %  12/12/18 1611 134/83 97.7 F (36.5 C) Axillary 71 16 -  12/12/18 1610 - - - - - 99 %  12/12/18 1605 - - - - - 99 %   12/12/18 1600 - - - - - 98 %  12/12/18 1555 - - - - - 99 %  12/12/18 1550 - - - - - 99 %  12/12/18 1545 - - - - - 99 %  12/12/18 1540 - - - - - 98 %  12/12/18 1535 - - - - - 98 %  12/12/18 1530 - - - - - 98 %  12/12/18 1525 - - - - - 99 %  12/12/18 1520 - - - - - 99 %  12/12/18 1515 - - - - - 98 %  12/12/18 1512 134/85 - - 80 - 99 %  12/12/18 1505 - - - - - 97 %  12/12/18 1500 - - - - - 99 %  12/12/18 1455 - - - - - 96 %  12/12/18 1450 - - - - - 97 %  12/12/18 1445 - - - - - 97 %  12/12/18 1440 - - - - - 99 %  12/12/18 1435 - - - - - 98 %  12/12/18 1430 - - - - - 100 %  12/12/18 1425 - - - - - 99 %  12/12/18 1420 - - - - - 99 %  12/12/18 1415 - - - - - 98 %  12/12/18 1411 128/80 - - 69 - -  12/12/18 1410 - - - - - 99 %  12/12/18 1405 - - - - - 99 %  12/12/18 1400 - - - - - 100 %  12/12/18 1355 - - - - - 98 %  12/12/18 1350 - - - - - 99 %  12/12/18 1345 - - - - - 99 %  12/12/18 1340 - - - - - 99 %  12/12/18 1335 - - - - - 100 %  12/12/18 1330 - - - - - 100 %  12/12/18 1325 - - - - - 100 %  12/12/18 1320 - - - - - 99 %  12/12/18 1317 - - - - - 98 %  12/12/18 1311 137/83 - - 82 - -  12/12/18 1310 - - - - - 98 %  12/12/18 1305 - - - - - 99 %  12/12/18 1300 - - - - - 99 %  12/12/18 1255 - - - - - 99 %  12/12/18 1250 - - - - - 99 %  12/12/18 1245 - - - - - 99 %  12/12/18 1240 - - - - - 98 %  12/12/18 1236 - 97.8 F (36.6 C) Oral - 17 96 %  12/12/18 1230 - - - - - 98 %  12/12/18 1225 - - - - - 98 %  12/12/18 1220 - - - - - 99 %  12/12/18 1215 - - - - - 98 %  12/12/18 1211 122/67 - - 72 - -  12/12/18 1210 - - - - - 98 %  12/12/18 1205 - - - - - 99 %  12/12/18 1200 - - - - - 99 %  12/12/18 1155 - - - - - 100 %  12/12/18 1150 - - - - - 100 %  12/12/18 1145 - - - - - 99 %  12/12/18 1140 - - - - - 99 %  12/12/18 1135 - - - - - 98 %  12/12/18 1130 - - - - - 100 %  12/12/18 1125 - - - - - 96 %  12/12/18 1120 - - - - - 100 %  12/12/18 1115 - - - - - 99 %  12/12/18  1111 132/82 - - 63 - -  12/12/18 1110 - - - - - 98 %  12/12/18 1105 - - - - - 98 %  12/12/18 1100 - - - - - 99 %  12/12/18 1055 - - - - - 100 %  12/12/18 1050 - - - - - 99 %  12/12/18 1040 - - - - - 98 %  12/12/18 1035 - - - - - 97 %  12/12/18 1030 - - - - - 98 %  12/12/18 1025 - - - - - 96 %  12/12/18 1020 - - - - - 97 %  12/12/18 1015 - - - - - 97 %  12/12/18 1011 118/60 - - 73 - -  12/12/18 1010 - - - - - 97 %  12/12/18 1005 - - - - - 97 %  12/12/18 1000 - 97.7 F (36.5 C) Oral - - 97 %  12/12/18 0955 - - - - - 97 %  12/12/18 0950 - - - - - 97 %   Physical Exam:  General: alert, cooperative and no distress Lochia: appropriate Uterine Fundus: firm/ deviated to the left/ slightly tender Perineum/Labial: intact, minimal bruising DVT Evaluation: No evidence of DVT seen on physical exam. Non pitting edema of lower legs and feet  Recent Labs    12/11/18 0851 12/13/18 0538  HGB 11.4* 9.5*  HCT 35.4* 29.3*  WBC 10.7* 12.1*  PLT 410* 319    Assessment/Plan: Stable PPD #1 Mild range blood pressure elevations during and since labor  CMP/PC ratio Mild anemia-vitamins and iron A POS/ VNI / RI TDAP 10/24/2018 Bottle Discharge tomorrow    LOS: 2 days   Farrel Connersolleen Zylpha Poynor 12/13/2018, 9:11 AM

## 2018-12-14 MED ORDER — CONCEPT DHA 53.5-38-1 MG PO CAPS: 1.0000 | ORAL_CAPSULE | Freq: Every day | ORAL | 11 refills | Status: AC

## 2018-12-14 MED ORDER — ACETAMINOPHEN 500 MG PO TABS: 1000.0000 mg | ORAL_TABLET | Freq: Four times a day (QID) | ORAL | 0 refills | Status: DC | PRN

## 2018-12-14 MED ORDER — VARICELLA VIRUS VACCINE LIVE 1350 PFU/0.5ML IJ SUSR
0.5000 mL | Freq: Once | INTRAMUSCULAR | Status: AC
  Administered 2018-12-14: 0.5 mL via SUBCUTANEOUS
  Filled 2018-12-14 (×2): qty 0.5

## 2018-12-14 NOTE — Progress Notes (Signed)
Pt discharged with infant. Discharge instructions, prescriptions, and follow up appointments given to and reviewed with patient. Pt verbalized understanding. Escorted out by staff. 

## 2018-12-19 ENCOUNTER — Ambulatory Visit (INDEPENDENT_AMBULATORY_CARE_PROVIDER_SITE_OTHER): Payer: BC Managed Care – PPO | Admitting: Obstetrics and Gynecology

## 2018-12-19 ENCOUNTER — Encounter: Payer: Self-pay | Admitting: Obstetrics and Gynecology

## 2018-12-19 ENCOUNTER — Other Ambulatory Visit: Payer: Self-pay

## 2018-12-19 NOTE — Progress Notes (Signed)
  OBSTETRICS POSTPARTUM CLINIC PROGRESS NOTE  Subjective:     Barbara Quinn is a 23 y.o. [phone] female who presents for a postpartum visit. She is 1 week postpartum following a Term pregnancy and delivery by Vaginal, no problems at delivery.  I have fully reviewed the prenatal and intrapartum course. Anesthesia: epidural.  Postpartum course has been complicated by uncomplicated.  Baby is feeding by Breast.  Bleeding: patient has not  resumed menses.  Bowel function is normal. Bladder function is normal.  Patient is not sexually active. Contraception method desired is undecided.  Postpartum depression screening: negative.  The following portions of the patient's history were reviewed and updated as appropriate: allergies, current medications, past family history, past medical history, past social history, past surgical history and problem list.  Review of Systems Pertinent items are noted in HPI.  Objective:    BP 134/74   Ht 5\' 8"  (1.727 m)   Wt 231 lb 3.2 oz (104.9 kg)   Breastfeeding Yes   BMI 35.15 kg/m   General:  alert and no distress   Breasts:  inspection negative, no nipple discharge or bleeding, no masses or nodularity palpable  Lungs: clear to auscultation bilaterally  Heart:  regular rate and rhythm, S1, S2 normal, no murmur, click, rub or gallop  Abdomen: soft, non-tender; bowel sounds normal; no masses,  no organomegaly.      Vulva:  normal  Vagina: normal vagina, no discharge, exudate, lesion, or erythema  Cervix:  no cervical motion tenderness and no lesions  Corpus: normal size, contour, position, consistency, mobility, non-tender  Adnexa:  normal adnexa and no mass, fullness, tenderness  Rectal Exam: Not performed.          Assessment:  Post Partum Care visit There are no diagnoses linked to this encounter.  Plan:  See orders and Patient Instructions Follow up in: 5 weeks or as needed.   Concept DHA needs prior authorization.   Adrian Prows MD [facility], [facility] 12/19/2018 4:36 PM

## 2019-02-01 ENCOUNTER — Ambulatory Visit: Payer: BC Managed Care – PPO | Admitting: Advanced Practice Midwife

## 2019-02-08 ENCOUNTER — Other Ambulatory Visit: Payer: Self-pay

## 2019-02-08 ENCOUNTER — Ambulatory Visit (INDEPENDENT_AMBULATORY_CARE_PROVIDER_SITE_OTHER): Payer: BC Managed Care – PPO | Admitting: Advanced Practice Midwife

## 2019-02-08 ENCOUNTER — Encounter: Payer: Self-pay | Admitting: Advanced Practice Midwife

## 2019-02-08 DIAGNOSIS — Z1389 Encounter for screening for other disorder: Secondary | ICD-10-CM

## 2019-02-08 DIAGNOSIS — Z30430 Encounter for insertion of intrauterine contraceptive device: Secondary | ICD-10-CM

## 2019-02-08 NOTE — Progress Notes (Signed)
Postpartum Visit  Chief Complaint:  Chief Complaint  Patient presents with  . Postpartum Care    History of Present Illness: Patient is a 23 y.o. [id number] presents for postpartum visit.   Review the Delivery Report for details.  Date of delivery: 12/12/2018 Type of delivery: Vaginal delivery - Vacuum or forceps assisted  no Episiotomy No.  Laceration: 1st degree/right labial repaired  Pregnancy or labor problems:  no Any problems since the delivery:  no  Newborn Details:  SINGLETON :  1. BabyGender female. Birth weight: 3680 g Maternal Details:  Breast or formula feeding: initiated breastfeeding and has since switched to formula Intercourse: No  Contraception after delivery: plans Mirena today Any bowel or bladder issues: No  Post partum depression/anxiety noted:  no Edinburgh Post-Partum Depression Score: 4 Date of last PAP: 1 year ago  no abnormalities   Review of Systems: Review of Systems  Constitutional: Negative.   HENT: Negative.   Eyes: Negative.   Respiratory: Negative.   Cardiovascular: Negative.   Gastrointestinal: Negative.   Genitourinary: Negative.   Musculoskeletal: Negative.   Skin: Negative.   Neurological: Negative.   Endo/Heme/Allergies: Negative.   Psychiatric/Behavioral: Negative.      Past Medical History:  Past Medical History:  Diagnosis Date  . Constipation   . Kidney stone   . 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 LITHOTRIPSY  2011  . tubes in ears      Family History:  Family History  Problem Relation Age of Onset  . Depression Mother   . Heart attack Mother   . GER disease Mother   . GER disease Father     Social History:  Social History   Socioeconomic History  . Marital status: Single    Spouse name: Not on file  . Number of children: Not on file  . Years of education: Not on file  . Highest education level: Not on file   Occupational History  . Not on file  Social Needs  . Financial resource strain: Not on file  . Food insecurity    Worry: Not on file    Inability: Not on file  . Transportation needs    Medical: Not on file    Non-medical: Not on file  Tobacco Use  . Smoking status: Current Every Day Smoker    Packs/day: 0.50  . Smokeless tobacco: Never Used  Substance and Sexual Activity  . Alcohol use: Not Currently  . Drug use: No  . Sexual activity: Not Currently    Birth control/protection: Other-see comments    Comment: Still deciding  Lifestyle  . Physical activity    Days per week: Not on file    Minutes per session: Not on file  . Stress: Not on file  Relationships  . Social Musician on phone: Not on file    Gets together: Not on file    Attends religious service: Not on file    Active member of club or organization: Not on file    Attends meetings of clubs or organizations: Not on file    Relationship status: Not on file  . Intimate partner violence    Fear of current or ex partner: Not on file    Emotionally abused: Not on file    Physically abused: Not on file    Forced sexual activity: Not on  file  Other Topics Concern  . Not on file  Social History Narrative  . Not on file    Allergies:  No Known Allergies  Medications: Prior to Admission medications   Medication Sig Start Date End Date Taking? Authorizing Provider  acetaminophen (TYLENOL) 500 MG tablet Take 2 tablets (1,000 mg total) by mouth every 6 (six) hours as needed for mild pain or moderate pain. 12/14/18  Yes Dalia Heading, CNM  sucralfate (CARAFATE) 1 g tablet Take 1 tablet (1 g total) by mouth 4 (four) times daily -  with meals and at bedtime. Patient taking differently: Take 1 g by mouth as needed.  11/04/18  Yes Schuman, Stefanie Libel, MD    Physical Exam Blood pressure 120/70, pulse 72, height 5\' 8"  (1.727 m), weight 216 lb 12.8 oz (98.3 kg), last menstrual period 01/28/2019, not  currently breastfeeding.    General: NAD HEENT: normocephalic, anicteric Pulmonary: No increased work of breathing Abdomen: NABS, soft, non-tender, non-distended.  Umbilicus without lesions.  No hepatomegaly, splenomegaly or masses palpable. No evidence of hernia. Genitourinary:  External: Normal external female genitalia.  Normal urethral meatus, normal Bartholin's and Skene's glands.    Vagina: Normal vaginal mucosa, no evidence of prolapse.  Lacerations have healed well  Cervix: Grossly normal in appearance, no bleeding, no CMT  Uterus: Non-enlarged, mobile, normal contour.    Adnexa: ovaries non-enlarged, no adnexal masses  Rectal: deferred Extremities: no edema, erythema, or tenderness Neurologic: Grossly intact Psychiatric: mood appropriate, affect full   Assessment: 23 y.o. K2H0623 presenting for 6 week postpartum visit  Plan: Problem List Items Addressed This Visit    None    Visit Diagnoses    6 weeks postpartum follow-up       Encounter for insertion of mirena IUD           1) Contraception - Education given regarding options for contraception, as well as compatibility with breast feeding if applicable.  Patient plans on IUD for contraception.  2)  Pap - ASCCP guidelines and rational discussed.  ASCCP guidelines and rational discussed.  Patient opts for every 3 years screening interval  3) Patient underwent screening for postpartum depression with no signs of depression  4) Return in about 4 weeks (around 03/08/2019) for iud string check/1 year annual.   Barbara Quinn, Barbara Quinn 02/08/2019, 11:13 AM     GYNECOLOGY OFFICE PROCEDURE NOTE  Barbara Quinn is a 23 y.o. [phone] here for Mirena IUD insertion. No GYN concerns.  Last pap smear was on 04/25/18 and was normal.  The patient is currently using postpartum abstinence for contraception and her LMP is Patient's last menstrual period was 01/28/2019 (approximate)..  The  indication for her IUD is contraception/cycle control.  IUD Insertion Procedure Note Patient identified, informed consent performed, consent signed.   Discussed risks of irregular bleeding, cramping, infection, malpositioning, expulsion or uterine perforation of the IUD (1:1000 placements)  which may require further procedure such as laparoscopy.  IUD while effective at preventing pregnancy do not prevent transmission of sexually transmitted diseases and use of barrier methods for this purpose was discussed. Time out was performed.  Urine pregnancy test negative.  Speculum placed in the vagina.  Anterior Cervix visualized.  Cleaned with Betadine x 2.  Grasped posteriorly with a single tooth tenaculum.  Uterus sounded to 8 cm. IUD placed per manufacturer's recommendations.  Strings trimmed to 3 cm. Tenaculum was removed, some bleeding noted, good hemostasis following pressure with large  Qtips.  Patient tolerated procedure well.    Patient was given post-procedure instructions.  She was advised to have backup contraception for one week.  Patient was also asked to check IUD strings periodically and follow up in 4 weeks for IUD check.  IUD insertion CPT 58300,  Skyla [id number] Mirena [id number] Liletta J7297 Paraguard J7300 Rutha BouchardKyleena [phone] Modifer 25, plus Modifer 79 is done during a global billing visit    Barbara Quinn, CNM [facility]

## 2019-02-08 NOTE — Patient Instructions (Signed)

## 2019-03-15 ENCOUNTER — Ambulatory Visit: Payer: BC Managed Care – PPO | Admitting: Advanced Practice Midwife

## 2019-10-31 ENCOUNTER — Other Ambulatory Visit: Payer: Self-pay | Admitting: Obstetrics & Gynecology

## 2020-02-26 ENCOUNTER — Other Ambulatory Visit: Payer: Self-pay

## 2020-02-26 ENCOUNTER — Ambulatory Visit
Admission: EM | Admit: 2020-02-26 | Discharge: 2020-02-26 | Disposition: A | Discharge status: Home / Self Care | Payer: BC Managed Care – PPO | Attending: Internal Medicine | Admitting: Internal Medicine

## 2020-02-26 ENCOUNTER — Encounter: Payer: Self-pay | Admitting: Emergency Medicine

## 2020-02-26 DIAGNOSIS — Z20822 Contact with and (suspected) exposure to covid-19: Secondary | ICD-10-CM | POA: Insufficient documentation

## 2020-02-26 DIAGNOSIS — Z5321 Procedure and treatment not carried out due to patient leaving prior to being seen by health care provider: Secondary | ICD-10-CM | POA: Insufficient documentation

## 2020-02-26 LAB — SARS CORONAVIRUS 2 (TAT 6-24 HRS): SARS Coronavirus 2: NEGATIVE

## 2020-02-26 NOTE — ED Triage Notes (Signed)
Patient c/o cough, nasal congestion and fatigue that started 1 week ago.

## 2020-05-28 ENCOUNTER — Other Ambulatory Visit: Payer: Self-pay

## 2020-05-28 ENCOUNTER — Ambulatory Visit
Admission: EM | Admit: 2020-05-28 | Discharge: 2020-05-28 | Disposition: A | Discharge status: Home / Self Care | Payer: BC Managed Care – PPO | Attending: Emergency Medicine | Admitting: Emergency Medicine

## 2020-05-28 DIAGNOSIS — H6992 Unspecified Eustachian tube disorder, left ear: Secondary | ICD-10-CM

## 2020-05-28 DIAGNOSIS — R42 Dizziness and giddiness: Secondary | ICD-10-CM

## 2020-05-28 DIAGNOSIS — H6982 Other specified disorders of Eustachian tube, left ear: Secondary | ICD-10-CM

## 2020-05-28 MED ORDER — MECLIZINE HCL 25 MG PO TABS: 25.0000 mg | ORAL_TABLET | Freq: Three times a day (TID) | ORAL | 0 refills | Status: DC | PRN

## 2020-05-28 NOTE — ED Triage Notes (Signed)
Pt presents with intermittent dizziness for past few days with some slight nausea.

## 2020-05-28 NOTE — Discharge Instructions (Addendum)
Take the meclizine 3 times a day as needed for your intermittent dizziness to see if helps with your symptoms.  You can perform some sinus irrigation as well using a NeilMed sinus rinse kit and warm distilled water to help clear the eustachian tube which might help with your ear fullness and your dizziness as well.  Increase your oral fluid intake to keep your secretions nice and thin.  If your symptoms worsen return for reevaluation or see your primary care provider.

## 2020-05-28 NOTE — ED Provider Notes (Signed)
[facility]    CSN: [id number] Arrival date & time: 05/28/20  1026      History   Chief Complaint Chief Complaint  Patient presents with  . Dizziness    [name] is a 24 y.o. female.   HPI   24 year old female here for evaluation of intermittent dizziness and nausea.  Patient reports that she has been having symptoms for the past 2 days.  Patient reports that it will usually start with some blurring of her vision and then when her vision clears up she feels like the room is spinning.  This is usually self-limiting and does not last long.  Patient also reports some fullness in both ears along with some ringing.  She states her hearing has been slightly muffled.  Patient denies vomiting, syncope, or URI symptoms.  Patient is not anything like this in the past.  Past Medical History:  Diagnosis Date  . Constipation   . Kidney stone   . Seizures ([place])    last seizure at age 44    Patient Active Problem List   Diagnosis Date Noted  . Postpartum care following vaginal delivery 12/12/2018  . Encounter for elective induction of labor 12/11/2018  . Positive GBS test 12/11/2018  . Pregnancy 11/20/2018  . Supervision of low-risk pregnancy, first trimester 05/09/2018    Past Surgical History:  Procedure Laterality Date  . LITHOTRIPSY  2011  . tubes in ears      OB History    Gravida  4   Para  1   Term  1   Preterm      AB  3   Living  1     SAB  3   IAB      Ectopic      Multiple  0   Live Births  1            Home Medications    Prior to Admission medications   Medication Sig Start Date End Date Taking? Authorizing Provider  acetaminophen (TYLENOL) 500 MG tablet Take 2 tablets (1,000 mg total) by mouth every 6 (six) hours as needed for mild pain or moderate pain. 12/14/18   Dalia Heading, CNM  levonorgestrel (MIRENA) 20 MCG/24HR IUD 1 each by Intrauterine route once. 02/08/19   Rod Can, CNM  meclizine  (ANTIVERT) 25 MG tablet Take 1 tablet (25 mg total) by mouth 3 (three) times daily as needed for dizziness. 05/28/20   Margarette Canada, NP  sucralfate (CARAFATE) 1 g tablet Take 1 tablet (1 g total) by mouth 4 (four) times daily -  with meals and at bedtime. Patient taking differently: Take 1 g by mouth as needed.  11/04/18   Homero Fellers, MD    Family History Family History  Problem Relation Age of Onset  . Depression Mother   . Heart attack Mother   . GER disease Mother   . GER disease Father     Social History Social History   Tobacco Use  . Smoking status: Current Every Day Smoker    Packs/day: 0.50  . Smokeless tobacco: Never Used  Vaping Use  . Vaping Use: Former  Substance Use Topics  . Alcohol use: Not Currently  . Drug use: No     Allergies   Patient has no known allergies.   Review of Systems Review of Systems  Constitutional: Negative for activity change, appetite change and fever.  HENT: Positive for ear pain, hearing loss and tinnitus.  Negative for postnasal drip, rhinorrhea, sinus pressure, sinus pain and sore throat.   Respiratory: Negative for cough and shortness of breath.   Gastrointestinal: Positive for nausea. Negative for vomiting.  Skin: Negative for rash.  Neurological: Positive for dizziness. Negative for syncope, weakness, light-headedness, numbness and headaches.  Psychiatric/Behavioral: Negative.      Physical Exam Triage Vital Signs ED Triage Vitals  Enc Vitals Group     BP 05/28/20 1053 (!) 148/99     Pulse Rate 05/28/20 1053 68     Resp 05/28/20 1053 17     Temp 05/28/20 1053 98.6 F (37 C)     Temp Source 05/28/20 1053 Oral     SpO2 05/28/20 1053 100 %     Weight --      Height --      Head Circumference --      Peak Flow --      Pain Score 05/28/20 1054 0     Pain Loc --      Pain Edu? --      Excl. in [place]? --    No data found.  Updated Vital Signs BP (!) 148/99 (BP Location: Right Arm)   Pulse 68   Temp 98.6 F  (37 C) (Oral)   Resp 17   SpO2 100%   Visual Acuity Right Eye Distance:   Left Eye Distance:   Bilateral Distance:    Right Eye Near:   Left Eye Near:    Bilateral Near:     Physical Exam Vitals and nursing note reviewed.  Constitutional:      General: She is not in acute distress.    Appearance: Normal appearance. She is normal weight. She is not toxic-appearing.  HENT:     Head: Normocephalic and atraumatic.     Right Ear: Tympanic membrane, ear canal and external ear normal.     Left Ear: Tympanic membrane, ear canal and external ear normal.     Ears:     Comments: Patient does have some fullness when palpating her left eustachian tube externally.  Right eustachian tube is unremarkable.    Nose: Nose normal. No congestion or rhinorrhea.     Mouth/Throat:     Mouth: Mucous membranes are moist.     Pharynx: Oropharynx is clear. No oropharyngeal exudate.  Eyes:     General: No scleral icterus.    Extraocular Movements: Extraocular movements intact.     Conjunctiva/sclera: Conjunctivae normal.     Pupils: Pupils are equal, round, and reactive to light.  Cardiovascular:     Rate and Rhythm: Normal rate and regular rhythm.     Pulses: Normal pulses.     Heart sounds: Normal heart sounds. No gallop.   Pulmonary:     Effort: Pulmonary effort is normal.     Breath sounds: Normal breath sounds. No wheezing or rales.  Musculoskeletal:        General: No swelling. Normal range of motion.     Cervical back: Normal range of motion and neck supple.  Lymphadenopathy:     Cervical: No cervical adenopathy.  Skin:    General: Skin is warm and dry.     Capillary Refill: Capillary refill takes less than 2 seconds.     Findings: No erythema or rash.  Neurological:     General: No focal deficit present.     Mental Status: She is alert and oriented to person, place, and time.     Cranial Nerves: No cranial nerve  deficit.     Sensory: No sensory deficit.     Motor: No weakness.      Coordination: Coordination normal.     Gait: Gait normal.     Deep Tendon Reflexes: Reflexes abnormal.     Comments: Patient is DTRs are not brisk globally.  Patient reports that this is normal for her.  Psychiatric:        Mood and Affect: Mood normal.        Behavior: Behavior normal.        Thought Content: Thought content normal.        Judgment: Judgment normal.      UC Treatments / Results  Labs (all labs ordered are listed, but only abnormal results are displayed) Labs Reviewed - No data to display  EKG   Radiology No results found.  Procedures Procedures (including critical care time)  Medications Ordered in UC Medications - No data to display  Initial Impression / Assessment and Plan / UC Course  I have reviewed the triage vital signs and the nursing notes.  Pertinent labs & imaging results that were available during my care of the patient were reviewed by me and considered in my medical decision making (see chart for details).   Is here for evaluation of intermittent dizziness and nausea that been gone for the past 2 days.  Patient has reported some ear complaints but on physical exam her ears are unremarkable.  Patient does have some tenderness and fullness to her left eustachian tube.  Questionable eustachian tube dysfunction.  The rest of her upper respiratory tree is unremarkable.  Neuro exam is unremarkable.  Symptom with intermittent blurring of vision and dizziness questionable for vertigo like syndrome.  Of meclizine to see if it helps with patient's symptoms.   Final Clinical Impressions(s) / UC Diagnoses   Final diagnoses:  Dizziness  Eustachian tube dysfunction, left     Discharge Instructions     Take the meclizine 3 times a day as needed for your intermittent dizziness to see if helps with your symptoms.  You can perform some sinus irrigation as well using a [employer]Med sinus rinse kit and warm distilled water to help clear the eustachian tube  which might help with your ear fullness and your dizziness as well.  Increase your oral fluid intake to keep your secretions nice and thin.  If your symptoms worsen return for reevaluation or see your primary care provider.    ED Prescriptions    Medication Sig Dispense Auth. Provider   meclizine (ANTIVERT) 25 MG tablet Take 1 tablet (25 mg total) by mouth 3 (three) times daily as needed for dizziness. 30 tablet Margarette Canada, NP     PDMP not reviewed this encounter.   Margarette Canada, NP 05/28/20 [phone]

## 2020-07-04 ENCOUNTER — Encounter: Payer: Self-pay | Admitting: Advanced Practice Midwife

## 2020-07-04 ENCOUNTER — Ambulatory Visit (INDEPENDENT_AMBULATORY_CARE_PROVIDER_SITE_OTHER): Payer: Medicaid Other | Admitting: Advanced Practice Midwife

## 2020-07-04 ENCOUNTER — Other Ambulatory Visit: Payer: Self-pay

## 2020-07-04 VITALS — BP 128/80 | Ht 68.0 in | Wt 202.0 lb

## 2020-07-04 DIAGNOSIS — Z01419 Encounter for gynecological examination (general) (routine) without abnormal findings: Secondary | ICD-10-CM | POA: Diagnosis not present

## 2020-07-04 DIAGNOSIS — Z30432 Encounter for removal of intrauterine contraceptive device: Secondary | ICD-10-CM | POA: Diagnosis not present

## 2020-07-04 DIAGNOSIS — Z30011 Encounter for initial prescription of contraceptive pills: Secondary | ICD-10-CM

## 2020-07-04 MED ORDER — NORETHINDRONE 0.35 MG PO TABS: 1.0000 | ORAL_TABLET | Freq: Every day | ORAL | 3 refills | Status: DC

## 2020-07-04 NOTE — Progress Notes (Signed)
Gynecology Annual Exam  PCP: Patient, No Pcp Per  Chief Complaint:  Chief Complaint  Patient presents with  . Annual Exam    Iud removed     History of Present Illness: Patient is a 25 y.o. [id number] presents for annual exam. The patient has no complaints today. She would like to have her IUD removed as she and her partner are contemplating conception in the near future. She would like to take birth control pills until they are ready. Since she is a smoker, she is advised to take POPs. She states she is trying to quit smoking. We discussed pre-conception health goals.  LMP: No LMP recorded. (Menstrual status: IUD).  Postcoital Bleeding: no Dysmenorrhea: no  The patient is sexually active. She currently uses IUD for contraception. She denies dyspareunia.  The patient does perform self breast exams.  There is no notable family history of breast or ovarian cancer in her family.  The patient wears seatbelts: yes.  The patient has regular exercise: she is active at her job and with her child, she tries to eat a healthy diet and stay hydrated, she admits adequate sleep.    The patient denies current symptoms of depression.    Review of Systems: Review of Systems  Constitutional: Negative for chills and fever.  HENT: Negative for congestion, ear discharge, ear pain, hearing loss, sinus pain and sore throat.   Eyes: Negative for blurred vision and double vision.  Respiratory: Negative for cough, shortness of breath and wheezing.   Cardiovascular: Negative for chest pain, palpitations and leg swelling.  Gastrointestinal: Negative for abdominal pain, blood in stool, constipation, diarrhea, heartburn, melena, nausea and vomiting.  Genitourinary: Negative for dysuria, flank pain, frequency, hematuria and urgency.  Musculoskeletal: Negative for back pain, joint pain and myalgias.  Skin: Negative for itching and rash.  Neurological: Negative for dizziness, tingling, tremors, sensory change,  speech change, focal weakness, seizures, loss of consciousness, weakness and headaches.  Endo/Heme/Allergies: Negative for environmental allergies. Does not bruise/bleed easily.  Psychiatric/Behavioral: Negative for depression, hallucinations, memory loss, substance abuse and suicidal ideas. The patient is not nervous/anxious and does not have insomnia.     Past Medical History:  There are no problems to display for this patient.   Past Surgical History:  Past Surgical History:  Procedure Laterality Date  . LITHOTRIPSY  2011  . tubes in ears      Gynecologic History:  No LMP recorded. (Menstrual status: IUD). Contraception: IUD Last Pap: 2.5 years ago Results were:  no abnormalities   Obstetric History: [id number]  Family History:  Family History  Problem Relation Age of Onset  . Depression Mother   . Heart attack Mother   . GER disease Mother   . GER disease Father     Social History:  Social History   Socioeconomic History  . Marital status: Single    Spouse name: Not on file  . Number of children: Not on file  . Years of education: Not on file  . Highest education level: Not on file  Occupational History  . Not on file  Tobacco Use  . Smoking status: Current Every Day Smoker    Packs/day: 0.50  . Smokeless tobacco: Never Used  Vaping Use  . Vaping Use: Former  Substance and Sexual Activity  . Alcohol use: Not Currently  . Drug use: No  . Sexual activity: Not Currently    Birth control/protection: I.U.D.    Comment: Still deciding  Other Topics  Concern  . Not on file  Social History Narrative  . Not on file   Social Determinants of Health   Financial Resource Strain: Not on file  Food Insecurity: Not on file  Transportation Needs: Not on file  Physical Activity: Not on file  Stress: Not on file  Social Connections: Not on file  Intimate Partner Violence: Not on file    Allergies:  No Known Allergies  Medications: Prior to Admission medications    Medication Sig Start Date End Date Taking? Authorizing Provider  norethindrone (MICRONOR) 0.35 MG tablet Take 1 tablet (0.35 mg total) by mouth daily. 07/04/20  Yes Tresea Mall, CNM    Physical Exam Vitals: Blood pressure 128/80, height 5\' 8"  (1.727 m), weight 202 lb (91.6 kg)  General: NAD HEENT: normocephalic, anicteric Thyroid: no enlargement, no palpable nodules Pulmonary: No increased work of breathing, CTAB Cardiovascular: RRR, distal pulses 2+ Breast: Breast symmetrical, no tenderness, no palpable nodules or masses, no skin or nipple retraction present, no nipple discharge.  No axillary or supraclavicular lymphadenopathy. Abdomen: NABS, soft, non-tender, non-distended.  Umbilicus without lesions.  No hepatomegaly, splenomegaly or masses palpable. No evidence of hernia  Genitourinary:  External: Normal external female genitalia.  Normal urethral meatus, normal Bartholin's and Skene's glands.    Vagina: Normal vaginal mucosa, no evidence of prolapse.    Cervix: Grossly normal in appearance, no bleeding, no CMT  Uterus: Non-enlarged, mobile, normal contour.    Adnexa: ovaries non-enlarged, no adnexal masses  Rectal: deferred  Lymphatic: no evidence of inguinal lymphadenopathy Extremities: no edema, erythema, or tenderness Neurologic: Grossly intact Psychiatric: mood appropriate, affect full   Assessment: 25 y.o. 25 routine annual exam  Plan: Problem List Items Addressed This Visit   None   Visit Diagnoses    Well woman exam with routine gynecological exam    -  Primary   Relevant Medications   norethindrone (MICRONOR) 0.35 MG tablet   Encounter for removal of intrauterine contraceptive device (IUD)       Encounter for initial prescription of contraceptive pills       Relevant Medications   norethindrone (MICRONOR) 0.35 MG tablet      1) 4) Gardasil Series discussed and if applicable offered to patient - Patient has not previously completed 3 shot series    2) STI screening  was offered and declined  3)  ASCCP guidelines and rationale discussed.  Patient opts for every 3 years screening interval. PAP is due at next annual.  4) Contraception - the patient is currently using  IUD.  She is attempting to conceive in the near future and will take POPs in the meantime. We discussed safe sex practices to reduce her furture risk of STI's.    5) Return in about 1 year (around 07/04/2021) for annual.   07/06/2021, CNM [facility] 07/04/2020, 2:06 PM      GYNECOLOGY OFFICE PROCEDURE NOTE  EBELYN BOHNET is a 25 y.o. [phone] here for IUD removal. The patient currently has a Mirena IUD placed on 02/08/19.  No GYN concerns.  Last pap smear was on 04/25/2018 and was normal.  IUD Removal  Patient identified, informed consent performed, consent signed.  Time out was performed. Speculum placed in the vagina. The strings of the IUD were grasped and pulled using curved forceps. The IUD was successfully removed in its entirety. Patient tolerated procedure well.    13/04/2018, CNM [facility]   IUD  remval [id number] Modifer 25, plus Modifer 79 is done during a global billing visit

## 2020-09-06 ENCOUNTER — Other Ambulatory Visit: Payer: Self-pay

## 2020-09-06 ENCOUNTER — Ambulatory Visit (LOCAL_COMMUNITY_HEALTH_CENTER): Payer: Medicaid Other

## 2020-09-06 VITALS — BP 137/92 | Ht 68.0 in | Wt 206.0 lb

## 2020-09-06 DIAGNOSIS — Z3201 Encounter for pregnancy test, result positive: Secondary | ICD-10-CM

## 2020-09-06 LAB — PREGNANCY, URINE: Preg Test, Ur: POSITIVE — AB

## 2020-09-06 MED ORDER — PRENATAL 27-0.8 MG PO TABS: 1.0000 | ORAL_TABLET | Freq: Every day | ORAL | 0 refills | Status: AC

## 2020-09-06 NOTE — Progress Notes (Signed)
UPT positive. Elevated BP today 121/104 and 15 min later 137/92. Reports hx HBP during last preg. On no BP meds. Plans care at [facility]. Consult Elveria Rising, FNP who recommends pt to establish prenatal care ASAP. RN explained provider recommendations. Pt in agreement. To clerk/DSS/ [place] Medicaid  for preadmit. Jerel Shepherd, RN

## 2020-09-08 NOTE — Progress Notes (Signed)
Consulted by RN re: patient situation.  Reviewed RN note and agree that it reflects our discussion and my recommendations.  ° ° °Janann Boeve, FNP  °

## 2020-11-29 ENCOUNTER — Ambulatory Visit (LOCAL_COMMUNITY_HEALTH_CENTER): Payer: Medicaid Other | Admitting: Family Medicine

## 2020-11-29 ENCOUNTER — Other Ambulatory Visit: Payer: Self-pay

## 2020-11-29 ENCOUNTER — Encounter: Payer: Self-pay | Admitting: Family Medicine

## 2020-11-29 VITALS — BP 142/100 | Ht 68.0 in | Wt 197.4 lb

## 2020-11-29 DIAGNOSIS — Z720 Tobacco use: Secondary | ICD-10-CM

## 2020-11-29 DIAGNOSIS — Z30011 Encounter for initial prescription of contraceptive pills: Secondary | ICD-10-CM

## 2020-11-29 DIAGNOSIS — Z3009 Encounter for other general counseling and advice on contraception: Secondary | ICD-10-CM

## 2020-11-29 DIAGNOSIS — Z0000 Encounter for general adult medical examination without abnormal findings: Secondary | ICD-10-CM

## 2020-11-29 DIAGNOSIS — R030 Elevated blood-pressure reading, without diagnosis of hypertension: Secondary | ICD-10-CM

## 2020-11-29 MED ORDER — NORETHINDRONE 0.35 MG PO TABS: 1.0000 | ORAL_TABLET | Freq: Every day | ORAL | 5 refills | Status: AC

## 2020-11-29 NOTE — Progress Notes (Signed)
Pt accepted condoms. Pt counseled about PCP resources and also accepted smoking Quit card. Provider orders completed.

## 2020-11-29 NOTE — Progress Notes (Signed)
[facility] DEPARTMENT [facility] [address] Main Number: [phone]    Family Planning Visit- Initial Visit  Subjective:  Barbara Quinn is a 25 y.o.  [phone]   being seen today for an initial annual visit and to discuss contraceptive options.  The patient is currently using Progesterone only pills for pregnancy prevention. Patient reports she does not want a pregnancy in the next year.  Patient has the following medical conditions does not have any active problems on file.  Chief Complaint  Patient presents with   Annual Exam   Contraception    Depo (in hip)    Patient reports her for physical and wants to start Depo provera   Patient denies any concerns with need for Sti testing   Body mass index is 30.01 kg/m. - Patient is eligible for diabetes screening based on BMI and age >15?  not applicable HA1C ordered? not applicable  Patient reports 1  partner/s in last year. Desires STI screening?  No - declines   Has patient been screened once for HCV in the past?  No  No results found for: HCVAB  Does the patient have current drug use (including MJ), have a partner with drug use, and/or has been incarcerated since last result? No  If yes-- Screen for HCV through [facility]   Does the patient meet criteria for HBV testing? No  Criteria:  -Household, sexual or needle sharing contact with HBV -History of drug use -HIV positive -Those with known Hep C   Health Maintenance Due  Topic Date Due   Pneumococcal Vaccine 48-57 Years old (1 - PCV) Never done   Hepatitis C Screening  Never done   COVID-19 Vaccine (3 - Booster for Moderna series) 05/20/2020    Review of Systems  Constitutional:  Negative for chills, fever, malaise/fatigue and weight loss.  HENT:  Negative for congestion, hearing loss and sore throat.   Eyes:  Negative for blurred vision, double vision and photophobia.  Respiratory:  Negative for shortness of breath.    Cardiovascular:  Negative for chest pain.  Gastrointestinal:  Negative for abdominal pain, blood in stool, constipation, diarrhea, heartburn, nausea and vomiting.  Genitourinary:  Negative for dysuria and frequency.  Musculoskeletal:  Negative for back pain, joint pain and neck pain.  Skin:  Negative for itching and rash.  Neurological:  Positive for headaches. Negative for dizziness and weakness.       Reports frequent headaches, denies s/sx of migraines.   Endo/Heme/Allergies:  Does not bruise/bleed easily.  Psychiatric/Behavioral:  Negative for depression, substance abuse and suicidal ideas.    The following portions of the patient's history were reviewed and updated as appropriate: allergies, current medications, past family history, past medical history, past social history, past surgical history and problem list. Problem list updated.   See flowsheet for other program required questions.  Objective:   Vitals:   11/29/20 0951 11/29/20 1007  BP: (!) 145/95 (!) 142/100  Weight: 197 lb 6.4 oz (89.5 kg)   Height: 5\' 8"  (1.727 m)     Physical Exam Vitals and nursing note reviewed.  Constitutional:      Appearance: Normal appearance. She is obese.  HENT:     Head: Normocephalic and atraumatic.     Mouth/Throat:     Mouth: Mucous membranes are moist.     Dentition: Normal dentition. No dental caries.     Pharynx: No oropharyngeal exudate or posterior oropharyngeal erythema.  Eyes:  General: No scleral icterus. Neck:     Thyroid: No thyroid mass or thyromegaly.  Cardiovascular:     Rate and Rhythm: Normal rate and regular rhythm.     Pulses: Normal pulses.     Heart sounds: Normal heart sounds.  Pulmonary:     Effort: Pulmonary effort is normal.     Breath sounds: Normal breath sounds.  Abdominal:     General: Bowel sounds are normal.     Palpations: Abdomen is soft.  Genitourinary:    Comments: Deferred  Musculoskeletal:        General: Normal range of motion.      Cervical back: Normal range of motion and neck supple.  Lymphadenopathy:     Cervical: No cervical adenopathy.  Skin:    General: Skin is warm and dry.  Neurological:     General: No focal deficit present.     Mental Status: She is alert and oriented to person, place, and time.  Psychiatric:        Behavior: Behavior normal.      Assessment and Plan:  [name] is a 25 y.o. female presenting to the [facility] Department for an initial annual wellness/contraceptive visit   1. Routine general medical examination at a health care facility Well woman physical - pt wants to start Depo  Pap due 04/2021  2. Family planning Contraception counseling: Reviewed all forms of birth control options in the tiered based approach. available including abstinence; over the counter/barrier methods; hormonal contraceptive medication including pill, patch, ring, injection,contraceptive implant, ECP; hormonal and nonhormonal IUDs; permanent sterilization options including vasectomy and the various tubal sterilization modalities. Risks, benefits, and typical effectiveness rates were reviewed.  Questions were answered.  Written information was also given to the patient to review.  Patient desires Depo provera, this was  not prescribed for patient.  She was prescribed Micronor d/t elevated blood pressure. She will follow up in  3-6 months for surveillance.  She was told to call with any further questions, or with any concerns about this method of contraception.  Emphasized use of condoms 100% of the time for STI prevention.  Patient was offered ECP. ECP was not accepted by the patient. ECP counseling was not given.     3. Encounter for initial prescription of contraceptive pills Patient desires depo provera as method of BC, d/t elevated blood pressures in clinic and smoking history, suggested patient to continue Mircronor until see PCP for B/P and B/P is under control.   Once B/P is  managed will change to desired method.  Patient verbalizes understanding.    - norethindrone (MICRONOR) 0.35 MG tablet; Take 1 tablet (0.35 mg total) by mouth daily.  Dispense: 28 tablet; Refill: 5  4. Elevated blood pressure reading Pt blood pressures today in clinic were elevated 145/95, 7 minus later 142/100, 20 mins later 140/96.  Patient reports HA and family history of HTN.  Recommended to see PCP for B/P management.  PCP list given.    5. Tobacco abuse Discussed smoking and HTN and BC.  Encouraged pt to quit smoking.  Quitline information given.       Return in about 1 year (around 11/29/2021) for annual well woman exam.  No future appointments.  Wendi Snipes, FNP

## 2020-11-29 NOTE — Progress Notes (Signed)
Pt to clinic for physical and to start depo. Pt has not missed any pills or taken any late since her last normal period.

## 2023-10-22 ENCOUNTER — Encounter: Payer: Self-pay | Admitting: Emergency Medicine

## 2023-10-22 ENCOUNTER — Ambulatory Visit: Admission: EM | Admit: 2023-10-22 | Discharge: 2023-10-22 | Disposition: A | Discharge status: Home / Self Care

## 2023-10-22 DIAGNOSIS — L600 Ingrowing nail: Secondary | ICD-10-CM

## 2023-10-22 DIAGNOSIS — L03031 Cellulitis of right toe: Secondary | ICD-10-CM

## 2023-10-22 DIAGNOSIS — L03032 Cellulitis of left toe: Secondary | ICD-10-CM

## 2023-10-22 MED ORDER — MUPIROCIN 2 % EX OINT: 1.0000 | TOPICAL_OINTMENT | Freq: Two times a day (BID) | CUTANEOUS | 0 refills | Status: AC

## 2023-10-22 MED ORDER — DOXYCYCLINE HYCLATE 100 MG PO CAPS: 100.0000 mg | ORAL_CAPSULE | Freq: Two times a day (BID) | ORAL | 0 refills | Status: AC

## 2023-10-22 NOTE — Discharge Instructions (Addendum)
-   Begin antibiotics.  May perform Epsom salt soaks daily. - Apply the ointment I sent - Keep appointment with podiatry next week

## 2023-10-22 NOTE — ED Provider Notes (Signed)
 [facility]    CSN: [id number] Arrival date & time: 10/22/23  0848      History   Chief Complaint Chief Complaint  Patient presents with   Toe Pain    Bilateral big toes    [name] is a 28 y.o. female presenting for approximately 1 month history of redness, swelling, pain and pustular drainage from great toes bilaterally.  Patient saw PCP last month for this and was given Keflex.  States symptoms have improved until she manage medications tried to keep her toenails resolve and symptoms of gotten worse over the past week.  Has an appointment with podiatry next week.  Denies any associated fever.  HPI  Past Medical History:  Diagnosis Date   Constipation    Kidney stone    Seizures (HCC)    last seizure at age 13    Patient Active Problem List   Diagnosis Date Noted   Elevated blood pressure reading 11/29/2020   Tobacco abuse 11/29/2020    Past Surgical History:  Procedure Laterality Date   LITHOTRIPSY  2011   tubes in ears      OB History     Gravida  4   Para  1   Term  1   Preterm  0   AB  3   Living  1      SAB  3   IAB  0   Ectopic  0   Multiple  0   Live Births  1            Home Medications    Prior to Admission medications   Medication Sig Start Date End Date Taking? Authorizing Provider  doxycycline (VIBRAMYCIN) 100 MG capsule Take 1 capsule (100 mg total) by mouth 2 (two) times daily for 7 days. 10/22/23 10/29/23 Yes Floydene Hy, PA-C  labetalol (NORMODYNE) 100 MG tablet Take 100 mg by mouth 2 (two) times daily.   Yes [provider]  mupirocin ointment (BACTROBAN) 2 % Apply 1 Application topically 2 (two) times daily. 10/22/23  Yes Nancy Axon B, PA-C  topiramate (TOPAMAX) 50 MG tablet Take 50 mg by mouth daily. 09/24/23 09/23/24 Yes [provider]  norethindrone  (MICRONOR ) 0.35 MG tablet Take 1 tablet (0.35 mg total) by mouth daily. 11/29/20   Davy Estimable, FNP    Family  History Family History  Problem Relation Age of Onset   COPD Paternal Grandfather    Osteoporosis Paternal Grandmother    Heart attack Maternal Grandmother    Diabetes Maternal Grandmother    GER disease Father    Depression Mother    Heart attack Mother    GER disease Mother     Social History Social History   Tobacco Use   Smoking status: Every Day    Current packs/day: 0.50    Types: Cigarettes   Smokeless tobacco: Never   Tobacco comments:    about 6 cig/day  Vaping Use   Vaping status: Some Days  Substance Use Topics   Alcohol use: Not Currently    Comment: last use approx 2-1/2 years ago   Drug use: Never     Allergies   Patient has no known allergies.   Review of Systems Review of Systems  Constitutional:  Negative for fatigue and fever.  Musculoskeletal:  Positive for arthralgias and joint swelling. Negative for gait problem.  Skin:  Positive for color change and wound.  Neurological:  Negative for weakness and numbness.  Physical Exam Triage Vital Signs ED Triage Vitals  Encounter Vitals Group     BP 10/22/23 0859 (!) 134/91     Systolic BP Percentile --      Diastolic BP Percentile --      Pulse Rate 10/22/23 0859 82     Resp 10/22/23 0859 14     Temp 10/22/23 0859 97.9 F (36.6 C)     Temp Source 10/22/23 0859 Oral     SpO2 10/22/23 0859 98 %     Weight 10/22/23 0857 197 lb 5 oz (89.5 kg)     Height 10/22/23 0857 5\' 8"  (1.727 m)     Head Circumference --      Peak Flow --      Pain Score 10/22/23 0856 10     Pain Loc --      Pain Education --      Exclude from Growth Chart --    No data found.  Updated Vital Signs BP (!) 134/91 (BP Location: Right Arm)   Pulse 82   Temp 97.9 F (36.6 C) (Oral)   Resp 14   Ht 5\' 8"  (1.727 m)   Wt 197 lb 5 oz (89.5 kg)   LMP 10/08/2023 (Approximate)   SpO2 98%   BMI 30.00 kg/m       Physical Exam Vitals and nursing note reviewed.  Constitutional:      General: She is not in acute  distress.    Appearance: Normal appearance. She is not ill-appearing or toxic-appearing.  HENT:     Head: Normocephalic and atraumatic.  Eyes:     General: No scleral icterus.       Right eye: No discharge.        Left eye: No discharge.     Conjunctiva/sclera: Conjunctivae normal.  Cardiovascular:     Rate and Rhythm: Normal rate.  Pulmonary:     Effort: Pulmonary effort is normal. No respiratory distress.  Musculoskeletal:     Cervical back: Neck supple.  Skin:    General: Skin is dry.     Comments: GREAT TOES: There is significant swelling, erythema and tenderness distal toes with most about the medial nail folds.  Pustular drainage noted.  Portions of nails have been clipped away so no visible ingrown toenails.  Neurological:     General: No focal deficit present.     Mental Status: She is alert. Mental status is at baseline.     Motor: No weakness.     Gait: Gait normal.  Psychiatric:        Mood and Affect: Mood normal.        Behavior: Behavior normal.      UC Treatments / Results  Labs (all labs ordered are listed, but only abnormal results are displayed) Labs Reviewed - No data to display  EKG   Radiology No results found.  Procedures Procedures (including critical care time)  Medications Ordered in UC Medications - No data to display  Initial Impression / Assessment and Plan / UC Course  I have reviewed the triage vital signs and the nursing notes.  Pertinent labs & imaging results that were available during my care of the patient were reviewed by me and considered in my medical decision making (see chart for details).   28 year old female presents for redness, swelling and pustular drainage of both great toenails.  She clipped ingrown toenails herself and symptoms became worse after she was initially treated with course of Keflex last month.  Has an appointment to see podiatry in 1 week.  Patient has visible infection.  No visible ingrown toenail as  she has clipped away multiple portions of the nails.  Will treat infection at this time with doxycycline and mupirocin ointment as she received Keflex last month.  Advised Epsom salt soaks.  Discussed wound care.  Advised keep appointment with podiatry next week.  Reviewed return precautions.   Final Clinical Impressions(s) / UC Diagnoses   Final diagnoses:  Cellulitis of toe of left foot  Cellulitis of toe of right foot  Ingrown toenail of both feet   Discharge Instructions      - Begin antibiotics.  May perform Epsom salt soaks daily. - Apply the ointment I sent - Keep appointment with podiatry next week  ED Prescriptions     Medication Sig Dispense Auth. Provider   doxycycline (VIBRAMYCIN) 100 MG capsule Take 1 capsule (100 mg total) by mouth 2 (two) times daily for 7 days. 14 capsule Nancy Axon B, PA-C   mupirocin ointment (BACTROBAN) 2 % Apply 1 Application topically 2 (two) times daily. 22 g Floydene Hy, PA-C      PDMP not reviewed this encounter.   Floydene Hy, PA-C 10/22/23 [phone]

## 2023-10-22 NOTE — ED Triage Notes (Signed)
 Patient reports redness, swelling and pain in both her big toes for a month.  Patient does have an appointment with Podiatrist next Friday.
